# Patient Record
Sex: Female | Born: 1965 | Race: White | Hispanic: No | Smoking: Former smoker
Health system: Southern US, Community
[De-identification: ages and names within clinical notes are randomized; demographics above are authoritative.]

## PROBLEM LIST (undated history)

## (undated) DIAGNOSIS — K760 Fatty (change of) liver, not elsewhere classified: Secondary | ICD-10-CM

## (undated) DIAGNOSIS — F4310 Post-traumatic stress disorder, unspecified: Secondary | ICD-10-CM

## (undated) DIAGNOSIS — M797 Fibromyalgia: Secondary | ICD-10-CM

## (undated) DIAGNOSIS — I10 Essential (primary) hypertension: Secondary | ICD-10-CM

## (undated) DIAGNOSIS — J449 Chronic obstructive pulmonary disease, unspecified: Secondary | ICD-10-CM

## (undated) DIAGNOSIS — G35D Multiple sclerosis, unspecified: Secondary | ICD-10-CM

## (undated) DIAGNOSIS — D649 Anemia, unspecified: Secondary | ICD-10-CM

## (undated) DIAGNOSIS — K219 Gastro-esophageal reflux disease without esophagitis: Secondary | ICD-10-CM

## (undated) DIAGNOSIS — G35 Multiple sclerosis: Secondary | ICD-10-CM

## (undated) DIAGNOSIS — F329 Major depressive disorder, single episode, unspecified: Secondary | ICD-10-CM

## (undated) DIAGNOSIS — T7840XA Allergy, unspecified, initial encounter: Secondary | ICD-10-CM

## (undated) DIAGNOSIS — Z5189 Encounter for other specified aftercare: Secondary | ICD-10-CM

## (undated) DIAGNOSIS — F410 Panic disorder [episodic paroxysmal anxiety] without agoraphobia: Secondary | ICD-10-CM

## (undated) DIAGNOSIS — M5030 Other cervical disc degeneration, unspecified cervical region: Secondary | ICD-10-CM

## (undated) HISTORY — DX: Encounter for other specified aftercare: Z51.89

## (undated) HISTORY — DX: Other cervical disc degeneration, unspecified cervical region: M50.30

## (undated) HISTORY — PX: TONSILLECTOMY: SUR1361

## (undated) HISTORY — DX: Allergy, unspecified, initial encounter: T78.40XA

## (undated) HISTORY — PX: OTHER SURGICAL HISTORY: SHX169

## (undated) HISTORY — PX: HEMORRHOID SURGERY: SHX153

## (undated) HISTORY — PX: ABDOMINAL HYSTERECTOMY: SHX81

## (undated) HISTORY — DX: Anemia, unspecified: D64.9

## (undated) HISTORY — PX: COLONOSCOPY: SHX174

## (undated) HISTORY — PX: WRIST SURGERY: SHX841

## (undated) HISTORY — PX: TUBAL LIGATION: SHX77

## (undated) HISTORY — PX: UPPER GASTROINTESTINAL ENDOSCOPY: SHX188

---

## 2006-08-03 ENCOUNTER — Encounter: Admission: RE | Admit: 2006-08-03 | Discharge: 2006-08-03 | Payer: Self-pay | Admitting: Internal Medicine

## 2006-12-05 ENCOUNTER — Encounter: Admission: RE | Admit: 2006-12-05 | Discharge: 2006-12-05 | Payer: Self-pay | Admitting: Occupational Medicine

## 2006-12-15 ENCOUNTER — Emergency Department (HOSPITAL_COMMUNITY): Admission: EM | Admit: 2006-12-15 | Discharge: 2006-12-15 | Payer: Self-pay | Admitting: Family Medicine

## 2007-02-06 ENCOUNTER — Encounter: Admission: RE | Admit: 2007-02-06 | Discharge: 2007-02-06 | Payer: Self-pay | Admitting: Internal Medicine

## 2007-10-19 ENCOUNTER — Ambulatory Visit (HOSPITAL_COMMUNITY): Admission: RE | Admit: 2007-10-19 | Discharge: 2007-10-19 | Payer: Self-pay | Admitting: Neurology

## 2007-11-06 ENCOUNTER — Ambulatory Visit (HOSPITAL_COMMUNITY): Admission: RE | Admit: 2007-11-06 | Discharge: 2007-11-06 | Payer: Self-pay | Admitting: Neurology

## 2007-11-13 ENCOUNTER — Ambulatory Visit (HOSPITAL_COMMUNITY): Admission: RE | Admit: 2007-11-13 | Discharge: 2007-11-13 | Payer: Self-pay | Admitting: Neurology

## 2008-10-21 ENCOUNTER — Emergency Department (HOSPITAL_COMMUNITY): Admission: EM | Admit: 2008-10-21 | Discharge: 2008-10-21 | Payer: Self-pay | Admitting: Emergency Medicine

## 2009-04-07 ENCOUNTER — Encounter: Admission: RE | Admit: 2009-04-07 | Discharge: 2009-04-07 | Payer: Self-pay | Admitting: Neurology

## 2009-10-24 ENCOUNTER — Emergency Department (HOSPITAL_COMMUNITY): Admission: EM | Admit: 2009-10-24 | Discharge: 2009-10-24 | Payer: Self-pay | Admitting: Family Medicine

## 2009-11-13 ENCOUNTER — Encounter: Admission: RE | Admit: 2009-11-13 | Discharge: 2009-11-13 | Payer: Self-pay | Admitting: Internal Medicine

## 2009-11-25 ENCOUNTER — Encounter: Admission: RE | Admit: 2009-11-25 | Discharge: 2009-11-25 | Payer: Self-pay | Admitting: Internal Medicine

## 2010-01-25 ENCOUNTER — Emergency Department (HOSPITAL_COMMUNITY): Admission: EM | Admit: 2010-01-25 | Discharge: 2010-01-25 | Payer: Self-pay | Admitting: Family Medicine

## 2010-02-03 ENCOUNTER — Encounter: Admission: RE | Admit: 2010-02-03 | Discharge: 2010-02-03 | Payer: Self-pay | Admitting: Internal Medicine

## 2010-08-27 ENCOUNTER — Ambulatory Visit (HOSPITAL_COMMUNITY)
Admission: RE | Admit: 2010-08-27 | Discharge: 2010-08-27 | Payer: Self-pay | Source: Home / Self Care | Admitting: Neurology

## 2010-10-17 ENCOUNTER — Encounter: Payer: Self-pay | Admitting: Neurology

## 2010-10-17 ENCOUNTER — Encounter: Payer: Self-pay | Admitting: Internal Medicine

## 2010-10-18 ENCOUNTER — Encounter: Payer: Self-pay | Admitting: Neurology

## 2010-10-19 ENCOUNTER — Encounter
Admission: RE | Admit: 2010-10-19 | Discharge: 2010-10-19 | Payer: Self-pay | Source: Home / Self Care | Attending: Internal Medicine | Admitting: Internal Medicine

## 2010-11-27 ENCOUNTER — Emergency Department (HOSPITAL_COMMUNITY)
Admission: EM | Admit: 2010-11-27 | Discharge: 2010-11-28 | Disposition: A | Discharge status: Home / Self Care | Payer: 59 | Attending: Emergency Medicine | Admitting: Emergency Medicine

## 2010-11-27 ENCOUNTER — Emergency Department (HOSPITAL_COMMUNITY): Payer: 59

## 2010-11-27 DIAGNOSIS — K219 Gastro-esophageal reflux disease without esophagitis: Secondary | ICD-10-CM | POA: Insufficient documentation

## 2010-11-27 DIAGNOSIS — S8000XA Contusion of unspecified knee, initial encounter: Secondary | ICD-10-CM | POA: Insufficient documentation

## 2010-11-27 DIAGNOSIS — F341 Dysthymic disorder: Secondary | ICD-10-CM | POA: Insufficient documentation

## 2010-11-27 DIAGNOSIS — M1990 Unspecified osteoarthritis, unspecified site: Secondary | ICD-10-CM | POA: Insufficient documentation

## 2010-11-27 DIAGNOSIS — M25529 Pain in unspecified elbow: Secondary | ICD-10-CM | POA: Insufficient documentation

## 2010-11-27 DIAGNOSIS — S52599A Other fractures of lower end of unspecified radius, initial encounter for closed fracture: Secondary | ICD-10-CM | POA: Insufficient documentation

## 2010-11-27 DIAGNOSIS — Y9351 Activity, roller skating (inline) and skateboarding: Secondary | ICD-10-CM | POA: Insufficient documentation

## 2010-11-27 DIAGNOSIS — M25429 Effusion, unspecified elbow: Secondary | ICD-10-CM | POA: Insufficient documentation

## 2010-11-27 DIAGNOSIS — IMO0002 Reserved for concepts with insufficient information to code with codable children: Secondary | ICD-10-CM | POA: Insufficient documentation

## 2010-11-27 DIAGNOSIS — M25539 Pain in unspecified wrist: Secondary | ICD-10-CM | POA: Insufficient documentation

## 2010-11-30 ENCOUNTER — Encounter (HOSPITAL_COMMUNITY)
Admission: RE | Admit: 2010-11-30 | Discharge: 2010-11-30 | Disposition: A | Discharge status: Home / Self Care | Payer: 59 | Source: Ambulatory Visit | Attending: Orthopaedic Surgery | Admitting: Orthopaedic Surgery

## 2010-11-30 DIAGNOSIS — Z01810 Encounter for preprocedural cardiovascular examination: Secondary | ICD-10-CM | POA: Insufficient documentation

## 2010-11-30 DIAGNOSIS — Z01812 Encounter for preprocedural laboratory examination: Secondary | ICD-10-CM | POA: Insufficient documentation

## 2010-11-30 LAB — CBC
HCT: 32.9 % — ABNORMAL LOW (ref 36.0–46.0)
Hemoglobin: 10.9 g/dL — ABNORMAL LOW (ref 12.0–15.0)
MCH: 27.5 pg (ref 26.0–34.0)
MCHC: 33.1 g/dL (ref 30.0–36.0)
RDW: 13 % (ref 11.5–15.5)

## 2010-11-30 LAB — BASIC METABOLIC PANEL
CO2: 31 mEq/L (ref 19–32)
Calcium: 8.9 mg/dL (ref 8.4–10.5)
Creatinine, Ser: 0.67 mg/dL (ref 0.4–1.2)
GFR calc Af Amer: >60 mL/min (ref >60)
GFR calc non Af Amer: >60 mL/min (ref >60)
Glucose, Bld: 78 mg/dL (ref 70–99)

## 2010-11-30 LAB — SURGICAL PCR SCREEN
MRSA, PCR: NEGATIVE
Staphylococcus aureus: NEGATIVE

## 2010-11-30 LAB — URINALYSIS, ROUTINE W REFLEX MICROSCOPIC
Bilirubin Urine: NEGATIVE
Hgb urine dipstick: NEGATIVE
Protein, ur: NEGATIVE mg/dL
Urobilinogen, UA: 0.2 mg/dL (ref 0.0–1.0)

## 2010-12-01 ENCOUNTER — Observation Stay (HOSPITAL_COMMUNITY)
Admission: RE | Admit: 2010-12-01 | Discharge: 2010-12-02 | Disposition: A | Discharge status: Home / Self Care | Payer: 59 | Source: Ambulatory Visit | Attending: Orthopaedic Surgery | Admitting: Orthopaedic Surgery

## 2010-12-01 DIAGNOSIS — F3289 Other specified depressive episodes: Secondary | ICD-10-CM | POA: Insufficient documentation

## 2010-12-01 DIAGNOSIS — X58XXXA Exposure to other specified factors, initial encounter: Secondary | ICD-10-CM | POA: Insufficient documentation

## 2010-12-01 DIAGNOSIS — F329 Major depressive disorder, single episode, unspecified: Secondary | ICD-10-CM | POA: Insufficient documentation

## 2010-12-01 DIAGNOSIS — S52539A Colles' fracture of unspecified radius, initial encounter for closed fracture: Principal | ICD-10-CM | POA: Insufficient documentation

## 2010-12-01 DIAGNOSIS — K219 Gastro-esophageal reflux disease without esophagitis: Secondary | ICD-10-CM | POA: Insufficient documentation

## 2010-12-01 DIAGNOSIS — F411 Generalized anxiety disorder: Secondary | ICD-10-CM | POA: Insufficient documentation

## 2010-12-15 ENCOUNTER — Inpatient Hospital Stay (INDEPENDENT_AMBULATORY_CARE_PROVIDER_SITE_OTHER)
Admission: RE | Admit: 2010-12-15 | Discharge: 2010-12-15 | Disposition: A | Discharge status: Home / Self Care | Payer: 59 | Source: Ambulatory Visit | Attending: Family Medicine | Admitting: Family Medicine

## 2010-12-15 DIAGNOSIS — K5900 Constipation, unspecified: Secondary | ICD-10-CM

## 2010-12-21 NOTE — Op Note (Signed)
  NAMEELESHIA, WOOLEY               ACCOUNT NO.:  [id number]  MEDICAL RECORD NO.:  [id number]           PATIENT TYPE:  I  LOCATION:  5008                         FACILITY:  [facility]  PHYSICIAN:  Ryker Pherigo C. Ophelia Charter, M.D.    DATE OF BIRTH:  1966-02-03  DATE OF PROCEDURE:  12/01/2010 DATE OF DISCHARGE:                              OPERATIVE REPORT   PREOPERATIVE DIAGNOSIS:  Right distal radius displaced intra-articular fracture.  POSTOPERATIVE DIAGNOSIS:  Right distal radius displaced intra-articular fracture.  PROCEDURE:  Open reduction and internal fixation, right distal radius fracture.  SURGEON:  Taraann Olthoff C. Ophelia Charter, MD  ANESTHESIA:  General with preoperative scalene block.  ESTIMATED BLOOD LOSS:  Minimal.  TOURNIQUET TIME:  Approximately 30 minutes Esmarch.  PROCEDURE:  After standard prepping and draping, preoperative Ancef prophylaxis, time-out procedure, sterile skin marker, Esmarch application up to the midforearm.  Incision was made cutting through the anterior flexor carpi radialis sheath and then pulling the FCR toward the radial side protecting radial artery, cutting through the posterior sheath, stripping the pronator from radial to ulnar side to expose the fracture.  Reduction was performed.  Small elevator was placed in the distal RU joint reducing the fragment back in anatomic position.  DVR plate, right narrow was selected, placed, pinned, slot hole drilled and then standard fixation with 1 proximal screw adjusting the length and filling the distal roll from ulnar side to radial including the styloid fragment, mostly 18 and 20-mm lengths and then the proximal row similarly.  All screws were locked down.  Checked, fluoroscopic pictures were taken.  The second screw was placed in the most proximal hole, bicortical 12 mm locked and spot fluoro pictures were taken confirming excellent position alignment.  Irrigation, Esmarch removal, hemostasis with cautery and standard  closure with 2-0 Vicryl in the subcutaneous tissue, 4-0 Vicryl in subcuticular closure.  Tincture of Benzoin, Steri- Strips, 4x4s, posterior splint, Webril and Ace wrap.  Time-out procedure was completed at the end of the case.  Instrument count and needle count was correct.     Maryn Freelove C. Ophelia Charter, M.D.     MCY/MEDQ  D:  12/01/2010  T:  12/02/2010  Job:  [id number]  Electronically Signed by Annell Greening M.D. on 12/21/2010 05:58:54 PM

## 2011-01-10 LAB — POCT RAPID STREP A (OFFICE): Streptococcus, Group A Screen (Direct): NEGATIVE

## 2011-05-01 IMAGING — CR DG WRIST COMPLETE 3+V*R*
4 series · 4 of 4 positions shown · non-contrast
Comparison: None

CLINICAL DATA: Right wrist pain, fell skating ring

RIGHT WRIST - COMPLETE 3+ VIEW

[x wrist pa right]
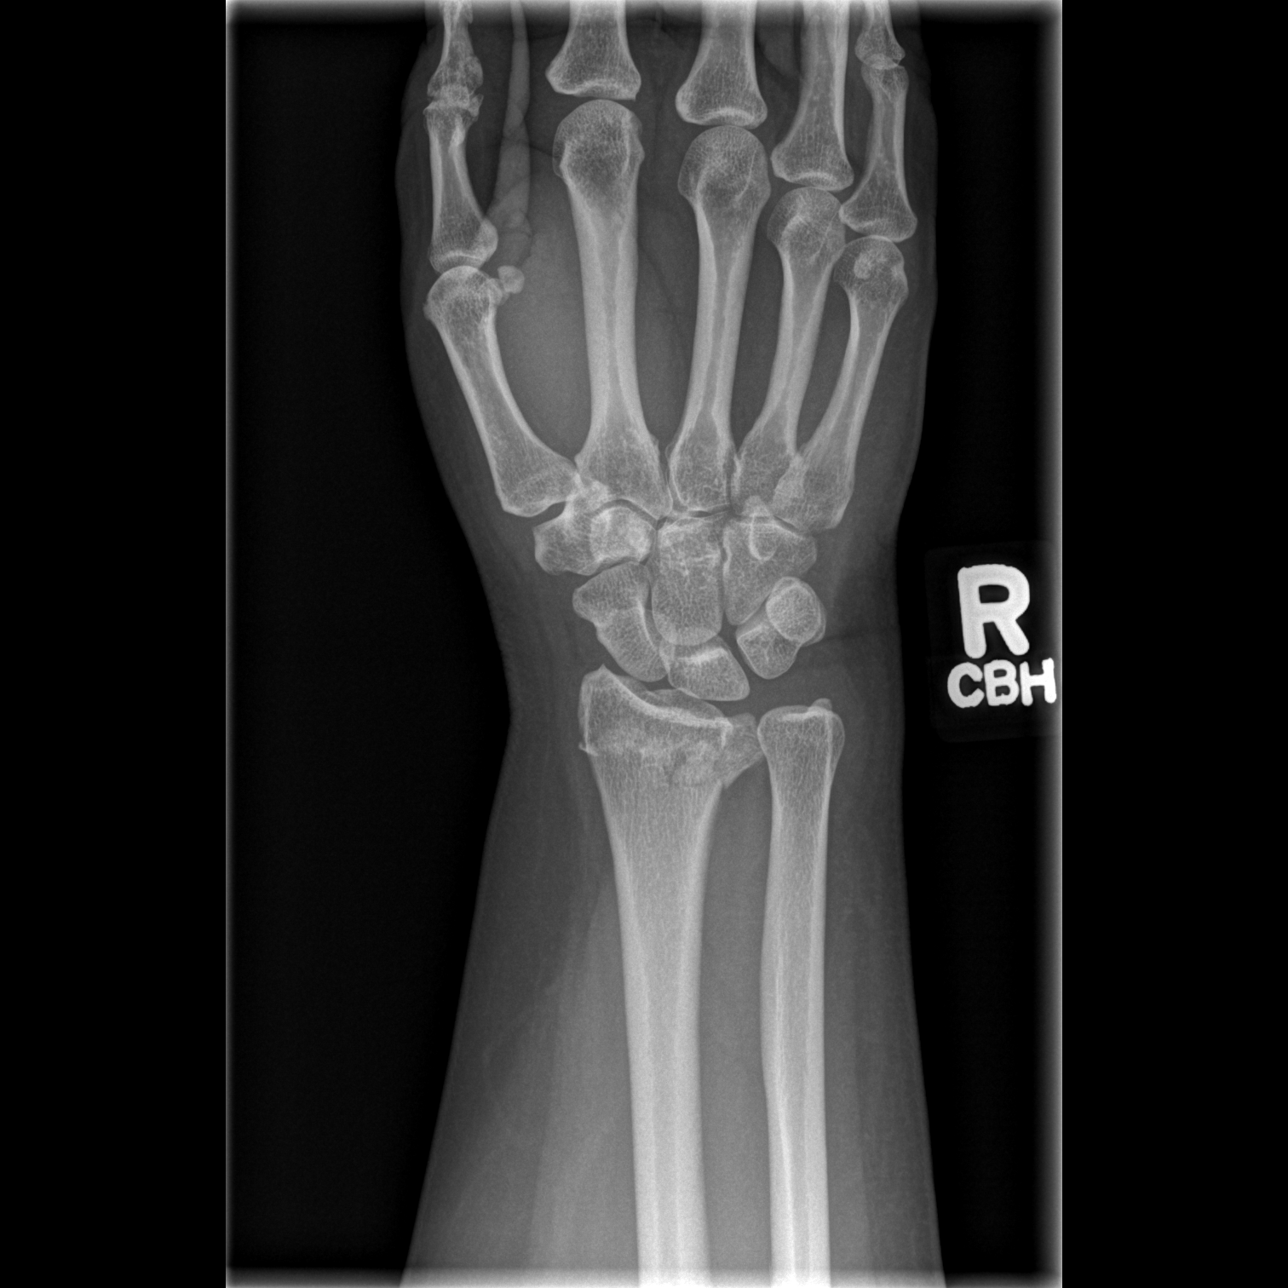

[x wrist obl right]
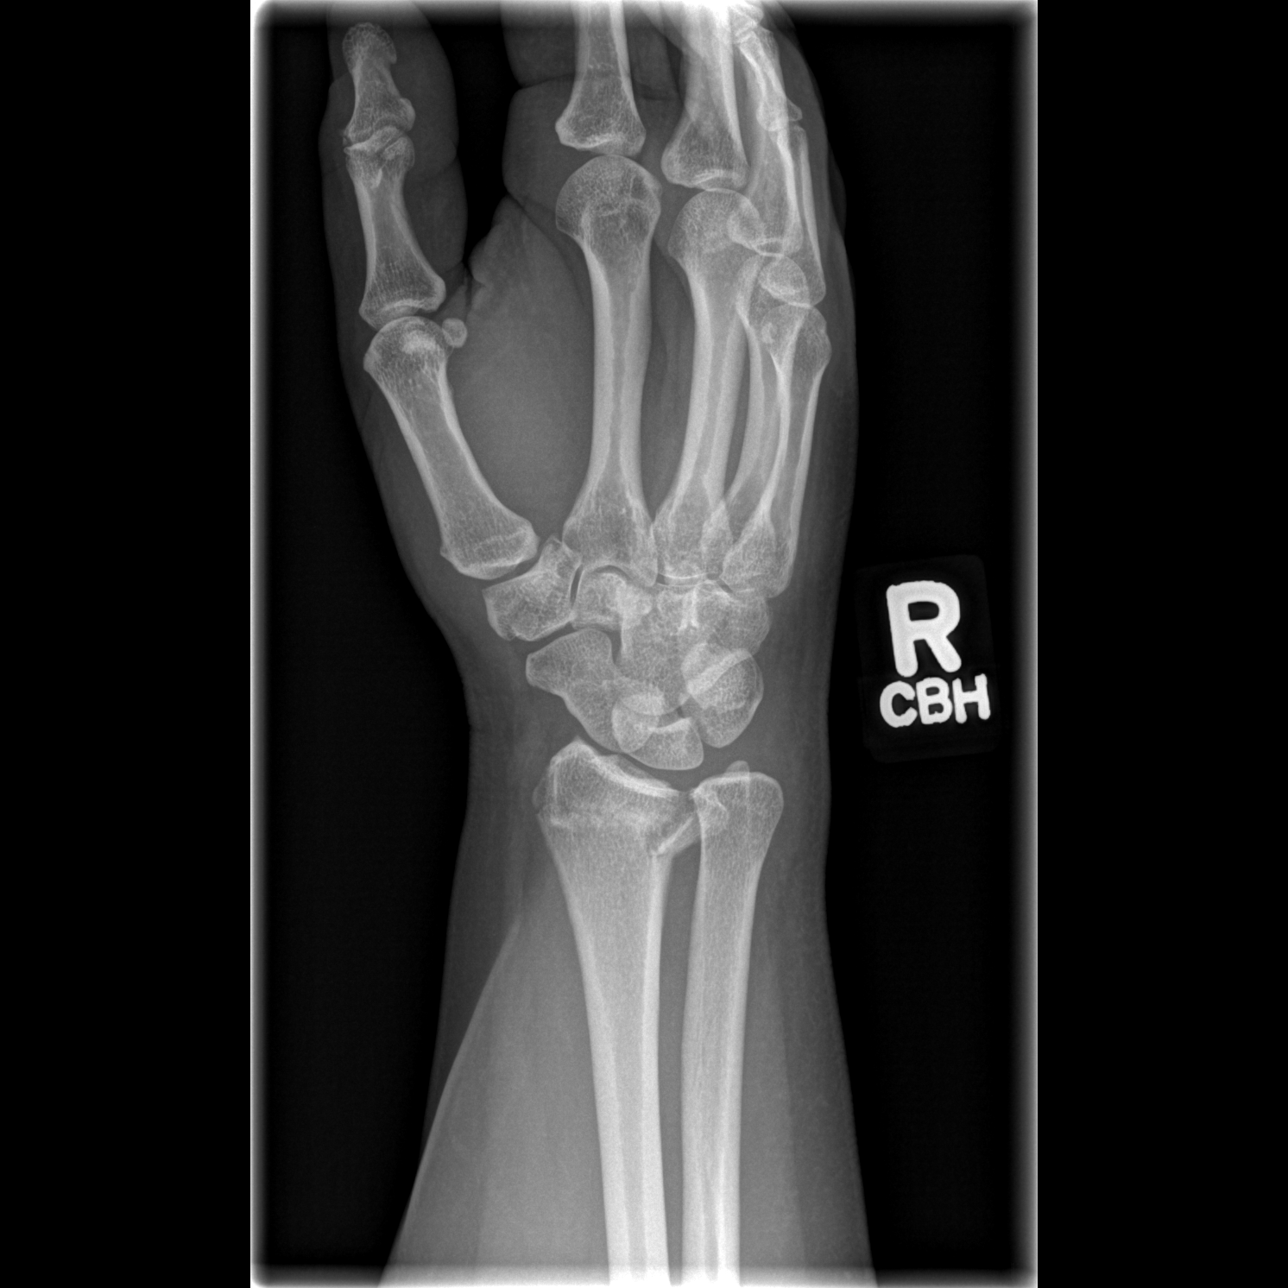

[x wrist lat right]
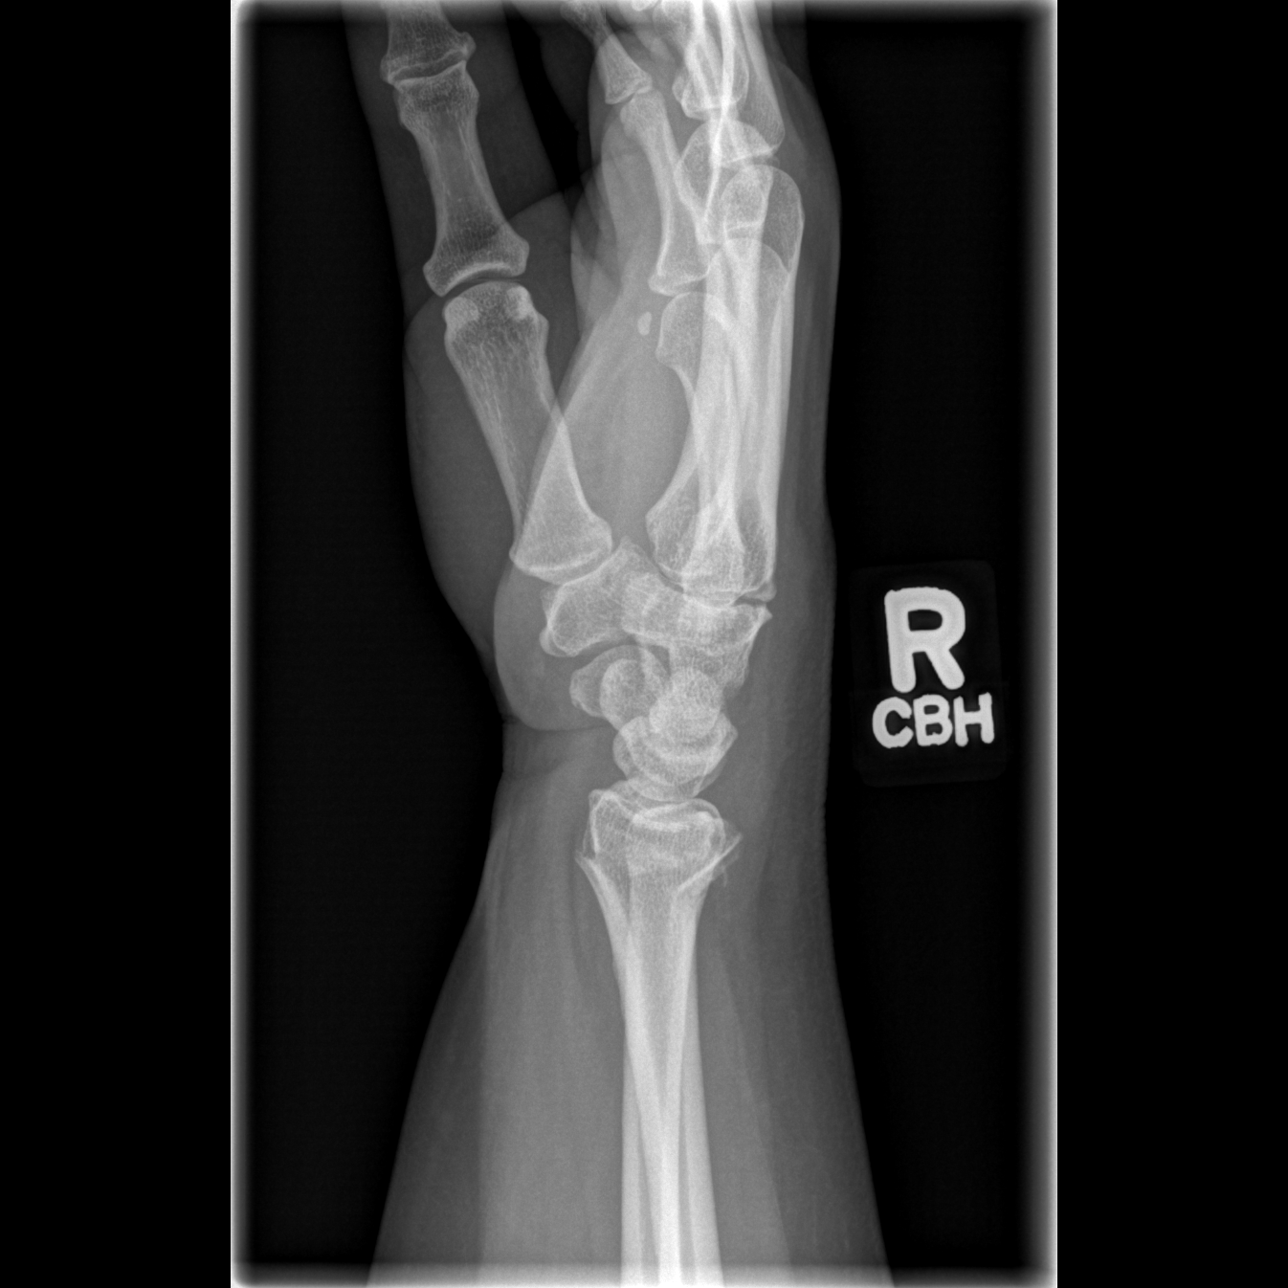

[x navicular]
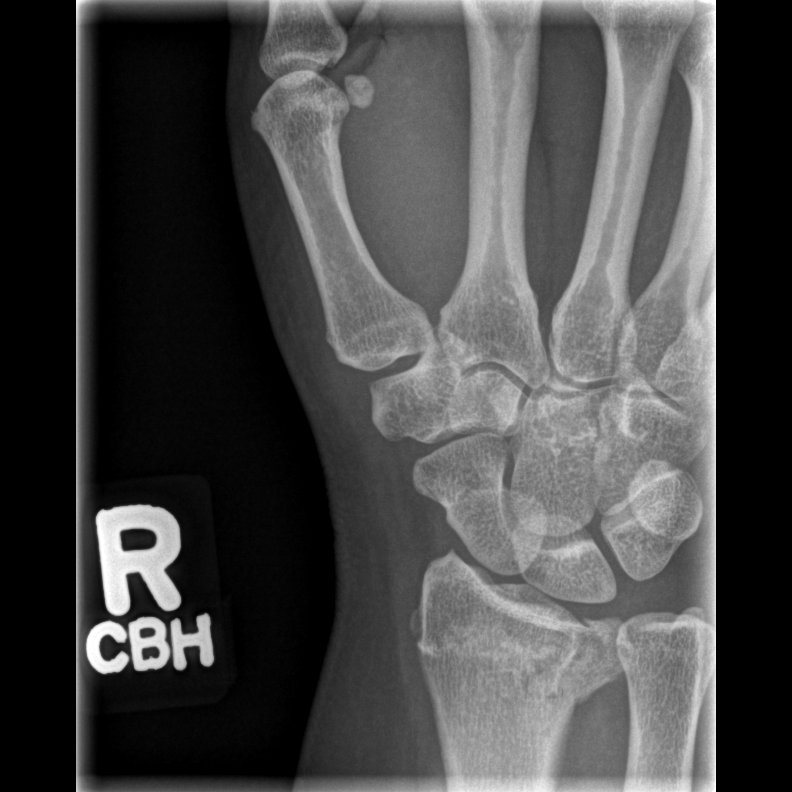

[4 of 4 positions shown; findings below may reference images not displayed]

FINDINGS: Comminuted intra-articular fracture distal right radial metaphysis.
Apex volar angulation with mild dorsal tilt of distal radial
articular surface.
Fracture extends intra-articular at the radiocarpal joint and
questionably the distal radioulnar joint.
No additional fracture, dislocation or bone destruction.
IMPRESSION: Comminuted intra-articular fracture distal right radius as above.

## 2011-06-17 LAB — GRAM STAIN

## 2011-06-17 LAB — OLIGOCLONAL BANDS, CSF + SERM
Albumin Index: 4.1 ratio (ref 0.0–9.0)
CSF Oligoclonal Bands: POSITIVE
IgG Index, CSF: 0.99 ratio — ABNORMAL HIGH (ref 0.28–0.66)
MS CNS IgG Synthesis Rate: 8.1 mg/d — ABNORMAL HIGH (ref 0.0–8.0)

## 2011-06-17 LAB — CSF CELL COUNT WITH DIFFERENTIAL
RBC Count, CSF: 4 — ABNORMAL HIGH
WBC, CSF: 6 — ABNORMAL HIGH

## 2011-06-17 LAB — B. BURGDORFI ANTIBODIES, CSF: Lyme Ab: 0.03 LIV

## 2011-12-20 ENCOUNTER — Other Ambulatory Visit (HOSPITAL_COMMUNITY): Payer: Self-pay | Admitting: Internal Medicine

## 2011-12-20 DIAGNOSIS — R110 Nausea: Secondary | ICD-10-CM

## 2011-12-22 ENCOUNTER — Ambulatory Visit (HOSPITAL_COMMUNITY)
Admission: RE | Admit: 2011-12-22 | Discharge: 2011-12-22 | Disposition: A | Discharge status: Home / Self Care | Payer: 59 | Source: Ambulatory Visit | Attending: Internal Medicine | Admitting: Internal Medicine

## 2011-12-22 DIAGNOSIS — R110 Nausea: Secondary | ICD-10-CM | POA: Insufficient documentation

## 2011-12-22 DIAGNOSIS — K7689 Other specified diseases of liver: Secondary | ICD-10-CM | POA: Insufficient documentation

## 2011-12-22 LAB — COMPREHENSIVE METABOLIC PANEL
ALT: 20 U/L (ref 0–35)
AST: 18 U/L (ref 0–37)
Albumin: 4 g/dL (ref 3.5–5.2)
Alkaline Phosphatase: 97 U/L (ref 39–117)
BUN: 8 mg/dL (ref 6–23)
CO2: 29 mEq/L (ref 19–32)
Calcium: 10 mg/dL (ref 8.4–10.5)
Chloride: 100 mEq/L (ref 96–112)
Creatinine, Ser: 0.61 mg/dL (ref 0.50–1.10)
GFR calc Af Amer: >90 mL/min (ref >90)
GFR calc non Af Amer: >90 mL/min (ref >90)
Glucose, Bld: 91 mg/dL (ref 70–99)
Potassium: 4.3 mEq/L (ref 3.5–5.1)
Sodium: 140 mEq/L (ref 135–145)
Total Bilirubin: 0.2 mg/dL — ABNORMAL LOW (ref 0.3–1.2)
Total Protein: 7.6 g/dL (ref 6.0–8.3)

## 2011-12-22 LAB — CBC
Hemoglobin: 12.7 g/dL (ref 12.0–15.0)
MCHC: 32.8 g/dL (ref 30.0–36.0)
RBC: 4.64 MIL/uL (ref 3.87–5.11)

## 2011-12-22 LAB — LIPID PANEL
HDL: 50 mg/dL (ref >39)
LDL Cholesterol: 152 mg/dL — ABNORMAL HIGH (ref 0–99)
Total CHOL/HDL Ratio: 4.6 RATIO

## 2011-12-22 LAB — TSH: TSH: 4.005 u[IU]/mL (ref 0.350–4.500)

## 2011-12-22 LAB — T3 UPTAKE: T3 Uptake Ratio: 31.8 % (ref 22.5–37.0)

## 2012-07-13 ENCOUNTER — Other Ambulatory Visit (HOSPITAL_COMMUNITY): Payer: Self-pay | Admitting: Internal Medicine

## 2012-07-13 DIAGNOSIS — IMO0001 Reserved for inherently not codable concepts without codable children: Secondary | ICD-10-CM

## 2012-07-13 DIAGNOSIS — M5137 Other intervertebral disc degeneration, lumbosacral region: Secondary | ICD-10-CM

## 2012-07-16 ENCOUNTER — Ambulatory Visit (HOSPITAL_COMMUNITY)
Admission: RE | Admit: 2012-07-16 | Discharge: 2012-07-16 | Disposition: A | Discharge status: Home / Self Care | Payer: 59 | Source: Ambulatory Visit | Attending: Internal Medicine | Admitting: Internal Medicine

## 2012-07-16 DIAGNOSIS — M6281 Muscle weakness (generalized): Secondary | ICD-10-CM | POA: Insufficient documentation

## 2012-07-16 DIAGNOSIS — R209 Unspecified disturbances of skin sensation: Secondary | ICD-10-CM | POA: Insufficient documentation

## 2012-07-16 DIAGNOSIS — IMO0001 Reserved for inherently not codable concepts without codable children: Secondary | ICD-10-CM

## 2012-07-16 DIAGNOSIS — M47814 Spondylosis without myelopathy or radiculopathy, thoracic region: Secondary | ICD-10-CM | POA: Insufficient documentation

## 2012-07-16 DIAGNOSIS — M51379 Other intervertebral disc degeneration, lumbosacral region without mention of lumbar back pain or lower extremity pain: Secondary | ICD-10-CM | POA: Insufficient documentation

## 2012-07-16 DIAGNOSIS — G35 Multiple sclerosis: Secondary | ICD-10-CM | POA: Insufficient documentation

## 2012-07-16 DIAGNOSIS — R269 Unspecified abnormalities of gait and mobility: Secondary | ICD-10-CM | POA: Insufficient documentation

## 2012-07-16 DIAGNOSIS — IMO0002 Reserved for concepts with insufficient information to code with codable children: Secondary | ICD-10-CM | POA: Insufficient documentation

## 2012-07-16 DIAGNOSIS — M519 Unspecified thoracic, thoracolumbar and lumbosacral intervertebral disc disorder: Secondary | ICD-10-CM | POA: Insufficient documentation

## 2012-07-16 DIAGNOSIS — M5137 Other intervertebral disc degeneration, lumbosacral region: Secondary | ICD-10-CM

## 2012-07-16 DIAGNOSIS — M5124 Other intervertebral disc displacement, thoracic region: Secondary | ICD-10-CM | POA: Insufficient documentation

## 2012-07-17 ENCOUNTER — Ambulatory Visit (HOSPITAL_COMMUNITY): Admission: RE | Admit: 2012-07-17 | Payer: 59 | Source: Ambulatory Visit

## 2012-07-17 ENCOUNTER — Ambulatory Visit (HOSPITAL_COMMUNITY): Payer: 59

## 2012-09-03 ENCOUNTER — Emergency Department (HOSPITAL_COMMUNITY)
Admission: EM | Admit: 2012-09-03 | Discharge: 2012-09-03 | Disposition: A | Discharge status: Home / Self Care | Payer: 59 | Attending: Emergency Medicine | Admitting: Emergency Medicine

## 2012-09-03 ENCOUNTER — Other Ambulatory Visit: Payer: Self-pay

## 2012-09-03 ENCOUNTER — Emergency Department (HOSPITAL_COMMUNITY): Payer: 59

## 2012-09-03 ENCOUNTER — Encounter (HOSPITAL_COMMUNITY): Payer: Self-pay

## 2012-09-03 DIAGNOSIS — J4489 Other specified chronic obstructive pulmonary disease: Secondary | ICD-10-CM | POA: Insufficient documentation

## 2012-09-03 DIAGNOSIS — K219 Gastro-esophageal reflux disease without esophagitis: Secondary | ICD-10-CM | POA: Insufficient documentation

## 2012-09-03 DIAGNOSIS — Z8669 Personal history of other diseases of the nervous system and sense organs: Secondary | ICD-10-CM | POA: Insufficient documentation

## 2012-09-03 DIAGNOSIS — R42 Dizziness and giddiness: Secondary | ICD-10-CM | POA: Insufficient documentation

## 2012-09-03 DIAGNOSIS — Z87891 Personal history of nicotine dependence: Secondary | ICD-10-CM | POA: Insufficient documentation

## 2012-09-03 DIAGNOSIS — Z8659 Personal history of other mental and behavioral disorders: Secondary | ICD-10-CM | POA: Insufficient documentation

## 2012-09-03 DIAGNOSIS — F410 Panic disorder [episodic paroxysmal anxiety] without agoraphobia: Secondary | ICD-10-CM | POA: Insufficient documentation

## 2012-09-03 DIAGNOSIS — K769 Liver disease, unspecified: Secondary | ICD-10-CM | POA: Insufficient documentation

## 2012-09-03 DIAGNOSIS — G35 Multiple sclerosis: Secondary | ICD-10-CM | POA: Insufficient documentation

## 2012-09-03 DIAGNOSIS — J449 Chronic obstructive pulmonary disease, unspecified: Secondary | ICD-10-CM | POA: Insufficient documentation

## 2012-09-03 DIAGNOSIS — Z8739 Personal history of other diseases of the musculoskeletal system and connective tissue: Secondary | ICD-10-CM | POA: Insufficient documentation

## 2012-09-03 DIAGNOSIS — F3289 Other specified depressive episodes: Secondary | ICD-10-CM | POA: Insufficient documentation

## 2012-09-03 DIAGNOSIS — F329 Major depressive disorder, single episode, unspecified: Secondary | ICD-10-CM | POA: Insufficient documentation

## 2012-09-03 DIAGNOSIS — Z79899 Other long term (current) drug therapy: Secondary | ICD-10-CM | POA: Insufficient documentation

## 2012-09-03 DIAGNOSIS — I10 Essential (primary) hypertension: Secondary | ICD-10-CM | POA: Insufficient documentation

## 2012-09-03 HISTORY — DX: Major depressive disorder, single episode, unspecified: F32.9

## 2012-09-03 HISTORY — DX: Post-traumatic stress disorder, unspecified: F43.10

## 2012-09-03 HISTORY — DX: Gastro-esophageal reflux disease without esophagitis: K21.9

## 2012-09-03 HISTORY — DX: Chronic obstructive pulmonary disease, unspecified: J44.9

## 2012-09-03 HISTORY — DX: Panic disorder (episodic paroxysmal anxiety): F41.0

## 2012-09-03 HISTORY — DX: Multiple sclerosis, unspecified: G35.D

## 2012-09-03 HISTORY — DX: Essential (primary) hypertension: I10

## 2012-09-03 HISTORY — DX: Fatty (change of) liver, not elsewhere classified: K76.0

## 2012-09-03 HISTORY — DX: Fibromyalgia: M79.7

## 2012-09-03 HISTORY — DX: Multiple sclerosis: G35

## 2012-09-03 LAB — URINALYSIS, ROUTINE W REFLEX MICROSCOPIC
Glucose, UA: NEGATIVE mg/dL
Hgb urine dipstick: NEGATIVE
Protein, ur: NEGATIVE mg/dL
Specific Gravity, Urine: 1.028 (ref 1.005–1.030)

## 2012-09-03 MED ORDER — MECLIZINE HCL 25 MG PO TABS
50.0000 mg | ORAL_TABLET | Freq: Once | ORAL | Status: AC
  Administered 2012-09-03: 50 mg via ORAL
  Filled 2012-09-03: qty 2

## 2012-09-03 MED ORDER — ONDANSETRON HCL 8 MG PO TABS: 8.0000 mg | ORAL_TABLET | Freq: Three times a day (TID) | ORAL | Status: DC | PRN

## 2012-09-03 MED ORDER — ONDANSETRON 8 MG PO TBDP
8.0000 mg | ORAL_TABLET | ORAL | Status: AC
  Administered 2012-09-03: 8 mg via ORAL
  Filled 2012-09-03: qty 1

## 2012-09-03 MED ORDER — MECLIZINE HCL 50 MG PO TABS: 50.0000 mg | ORAL_TABLET | Freq: Three times a day (TID) | ORAL | Status: AC | PRN

## 2012-09-03 NOTE — ED Notes (Signed)
Patient reports that she has had dizziness x 2 weeks and has gotten progressively worse. Patient reports that she has taken Vertigo and Xanax every 6 hours with no relief. Patient states her physician changed the xanax to Klonopin and still no relief. Patient has open sores on arms and trunk of body. Patient reports that she has severe anxiety and "picks at her skin." Patient als has a history of Multiple schlerosis.

## 2012-09-03 NOTE — ED Provider Notes (Signed)
History     CSN: [id number]  Arrival date & time 09/03/12  1407   First MD Initiated Contact with Patient 09/03/12 1523      Chief Complaint  Patient presents with  . Dizziness    (Consider location/radiation/quality/duration/timing/severity/associated sxs/prior treatment) HPI Pt presents with symptoms of dizziness and sensation of room spinning.  States the symptoms have been ongoing for the past 2-3 weeks.  She has been taking meclizine and zofran without much relief.  Meclizine 25mg .  States her psychiatrist changed her medication from xanax to klonopin and thought that may be the cause of the vertigo- but today told her that this would not be the cause as the symptoms would have resolved by now.  She has not seen her PMD.  No weakness in her extremities.  No changes in vision or speech.  Has had nausea, but no vomiting.  No fever.  There are no other associated systemic symptoms, there are no other alleviating or modifying factors.   Past Medical History  Diagnosis Date  . Panic disorder   . MS (multiple sclerosis)   . Hypertension   . COPD (chronic obstructive pulmonary disease)   . Major depressive disorder   . Nonalcoholic fatty liver disease   . GERD (gastroesophageal reflux disease)   . Fibromyalgia   . PTSD (post-traumatic stress disorder)     Past Surgical History  Procedure Date  . Tonsillectomy   . Cesarean section   . Hemorrhoid surgery   . Wrist surgery   . Tubal ligation     Family History  Problem Relation Age of Onset  . Hypertension Mother   . Heart failure Mother   . Hypertension Sister   . Stroke Sister     History  Substance Use Topics  . Smoking status: Former Games developer  . Smokeless tobacco: Never Used  . Alcohol Use: No    OB History    Grav Para Term Preterm Abortions TAB SAB Ect Mult Living                  Review of Systems ROS reviewed and all otherwise negative except for mentioned in HPI  Allergies  Other  Home Medications    Current Outpatient Rx  Name  Route  Sig  Dispense  Refill  . BACLOFEN 10 MG PO TABS   Oral   Take 10-20 mg by mouth 2 (two) times daily. 10 mg in am and 20 mg at bed time         . BUPROPION HCL ER (XL) 300 MG PO TB24   Oral   Take 300 mg by mouth daily.         Marland Kitchen CLONAZEPAM 1 MG PO TABS   Oral   Take 1-1.5 mg by mouth 2 (two) times daily as needed. I mg in am and 1.5 mg at bed time         . CYCLOBENZAPRINE HCL 10 MG PO TABS   Oral   Take 10 mg by mouth 3 (three) times daily as needed.         Marland Kitchen DOCUSATE SODIUM 100 MG PO CAPS   Oral   Take 100 mg by mouth 2 (two) times daily.         Marland Kitchen ESTRADIOL 0.5 MG PO TABS   Oral   Take 0.5 mg by mouth daily.         Marland Kitchen HYDROCHLOROTHIAZIDE 25 MG PO TABS   Oral   Take 25 mg  by mouth daily.         . MELOXICAM 15 MG PO TABS   Oral   Take 15 mg by mouth daily.         Marland Kitchen METHOCARBAMOL 500 MG PO TABS   Oral   Take 500 mg by mouth at bedtime.         . ADULT MULTIVITAMIN W/MINERALS CH   Oral   Take 1 tablet by mouth daily.         Marland Kitchen PANTOPRAZOLE SODIUM 40 MG PO TBEC   Oral   Take 40 mg by mouth 2 (two) times daily.         Marland Kitchen TEMAZEPAM 15 MG PO CAPS   Oral   Take 15-30 mg by mouth at bedtime as needed. Sleep         . TRAMADOL HCL 50 MG PO TABS   Oral   Take 50 mg by mouth every 6 (six) hours as needed. Pain         . MECLIZINE HCL 50 MG PO TABS   Oral   Take 1 tablet (50 mg total) by mouth 3 (three) times daily as needed.   30 tablet   0   . ONDANSETRON HCL 8 MG PO TABS   Oral   Take 1 tablet (8 mg total) by mouth every 8 (eight) hours as needed for nausea.   20 tablet   0     BP 152/87  Pulse 95  Temp 98 F (36.7 C) (Oral)  Resp 16  SpO2 100% Vitals reviewed Physical Exam Physical Examination: General appearance - alert, well appearing, and in no distress Mental status - alert, oriented to person, place, and time Eyes - PERRL, no nystagmus, EOMI, no conjunctival injection, no  scleral icterus Ears- TMS appear normal bilaterally, no erythema/pus/bulging/normal landmarks Mouth - mucous membranes moist, pharynx normal without lesions Chest - clear to auscultation, no wheezes, rales or rhonchi, symmetric air entry Heart - normal rate, regular rhythm, normal S1, S2, no murmurs, rubs, clicks or gallops Abdomen - soft, nontender, nondistended, no masses or organomegaly Neurological - alert, oriented, normal speech, no focal findings, strength 5/5 in extremities x 4, sensation intact Extremities - peripheral pulses normal, no pedal edema, no clubbing or cyanosis Skin - normal coloration and turgor, no rashes Psych- mildly anxious but calm and cooperative  ED Course  Procedures (including critical care time)  Labs Reviewed  URINALYSIS, ROUTINE W REFLEX MICROSCOPIC - Abnormal; Notable for the following:    Ketones, ur TRACE (*)     All other components within normal limits   Ct Head Wo Contrast  09/03/2012  *RADIOLOGY REPORT*  Clinical Data: Dizziness  CT HEAD WITHOUT CONTRAST  Technique:  Contiguous axial images were obtained from the base of the skull through the vertex without contrast.  Comparison: MRI brain dated 07/16/2012  Findings: No evidence of parenchymal hemorrhage or extra-axial fluid collection. No mass lesion, mass effect, or midline shift.  No CT evidence of acute infarction.  Periventricular/subcortical white matter abnormalities on MRI are not evident on CT.  Cerebral volume is age appropriate.  No ventriculomegaly.  The visualized paranasal sinuses are essentially clear. The mastoid air cells are unopacified.  No evidence of calvarial fracture.  IMPRESSION: No evidence of acute intracranial abnormality.  Periventricular/subcortical white matter abnormalities on MRI are not evident on CT.   Original Report Authenticated By: Charline Bills, M.D.      1. Vertigo  MDM  Pt presents with c/o vertigo- has been ongoing x several weeks.  Head CT  obtained and negative.  Increased dose of meclizine.  Do suspect BPPV due to increase symptoms with movement of head on exam.  However D/w patient the possibility of labrynthitis and other causes of vertigo.  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  I did warn her that if she had labrynthitis, steroids would be the treatment, but it does sound as if she has had severe reactions to steroids and is unwilling to take them at this time.  Pt advised to arrange for close f/u with her PMD, Dr Mikeal Hawthorne.  Discharged with strict return precautions.  Pt agreeable with plan.        Ethelda Chick, MD 09/03/12 1728

## 2013-02-13 ENCOUNTER — Ambulatory Visit: Payer: Self-pay | Admitting: Nurse Practitioner

## 2013-03-21 ENCOUNTER — Ambulatory Visit: Payer: Self-pay | Admitting: Nurse Practitioner

## 2015-08-12 ENCOUNTER — Other Ambulatory Visit: Payer: Self-pay

## 2015-08-12 DIAGNOSIS — Z1231 Encounter for screening mammogram for malignant neoplasm of breast: Secondary | ICD-10-CM

## 2015-09-01 ENCOUNTER — Ambulatory Visit: Payer: 59

## 2015-11-24 ENCOUNTER — Observation Stay (HOSPITAL_COMMUNITY)
Admission: EM | Admit: 2015-11-24 | Discharge: 2015-11-26 | Disposition: A | Discharge status: Home / Self Care | Payer: Medicaid Other | Attending: Family Medicine | Admitting: Family Medicine

## 2015-11-24 ENCOUNTER — Ambulatory Visit (INDEPENDENT_AMBULATORY_CARE_PROVIDER_SITE_OTHER): Payer: 59 | Admitting: Emergency Medicine

## 2015-11-24 VITALS — BP 118/88 | HR 107 | Temp 99.3°F | Resp 18 | Ht 66.0 in | Wt 204.2 lb

## 2015-11-24 DIAGNOSIS — D509 Iron deficiency anemia, unspecified: Secondary | ICD-10-CM | POA: Diagnosis not present

## 2015-11-24 DIAGNOSIS — D62 Acute posthemorrhagic anemia: Secondary | ICD-10-CM

## 2015-11-24 DIAGNOSIS — R55 Syncope and collapse: Secondary | ICD-10-CM | POA: Diagnosis present

## 2015-11-24 DIAGNOSIS — F22 Delusional disorders: Secondary | ICD-10-CM

## 2015-11-24 DIAGNOSIS — F332 Major depressive disorder, recurrent severe without psychotic features: Secondary | ICD-10-CM | POA: Insufficient documentation

## 2015-11-24 DIAGNOSIS — R42 Dizziness and giddiness: Secondary | ICD-10-CM | POA: Diagnosis not present

## 2015-11-24 DIAGNOSIS — Z79899 Other long term (current) drug therapy: Secondary | ICD-10-CM | POA: Insufficient documentation

## 2015-11-24 DIAGNOSIS — K219 Gastro-esophageal reflux disease without esophagitis: Secondary | ICD-10-CM | POA: Diagnosis not present

## 2015-11-24 DIAGNOSIS — F333 Major depressive disorder, recurrent, severe with psychotic symptoms: Secondary | ICD-10-CM | POA: Diagnosis present

## 2015-11-24 DIAGNOSIS — M797 Fibromyalgia: Secondary | ICD-10-CM | POA: Insufficient documentation

## 2015-11-24 DIAGNOSIS — F4310 Post-traumatic stress disorder, unspecified: Secondary | ICD-10-CM | POA: Diagnosis not present

## 2015-11-24 DIAGNOSIS — F32A Depression, unspecified: Secondary | ICD-10-CM

## 2015-11-24 DIAGNOSIS — K922 Gastrointestinal hemorrhage, unspecified: Secondary | ICD-10-CM | POA: Insufficient documentation

## 2015-11-24 DIAGNOSIS — G35D Multiple sclerosis, unspecified: Secondary | ICD-10-CM

## 2015-11-24 DIAGNOSIS — G35 Multiple sclerosis: Secondary | ICD-10-CM | POA: Diagnosis present

## 2015-11-24 DIAGNOSIS — I10 Essential (primary) hypertension: Secondary | ICD-10-CM | POA: Diagnosis not present

## 2015-11-24 DIAGNOSIS — E876 Hypokalemia: Secondary | ICD-10-CM

## 2015-11-24 DIAGNOSIS — F329 Major depressive disorder, single episode, unspecified: Secondary | ICD-10-CM | POA: Diagnosis not present

## 2015-11-24 DIAGNOSIS — Z87891 Personal history of nicotine dependence: Secondary | ICD-10-CM | POA: Diagnosis not present

## 2015-11-24 DIAGNOSIS — J449 Chronic obstructive pulmonary disease, unspecified: Secondary | ICD-10-CM | POA: Insufficient documentation

## 2015-11-24 DIAGNOSIS — R195 Other fecal abnormalities: Secondary | ICD-10-CM | POA: Insufficient documentation

## 2015-11-24 DIAGNOSIS — F6811 Factitious disorder with predominantly psychological signs and symptoms: Secondary | ICD-10-CM

## 2015-11-24 DIAGNOSIS — D508 Other iron deficiency anemias: Secondary | ICD-10-CM | POA: Diagnosis not present

## 2015-11-24 DIAGNOSIS — Z791 Long term (current) use of non-steroidal anti-inflammatories (NSAID): Secondary | ICD-10-CM | POA: Insufficient documentation

## 2015-11-24 DIAGNOSIS — D649 Anemia, unspecified: Secondary | ICD-10-CM | POA: Insufficient documentation

## 2015-11-24 DIAGNOSIS — T39395A Adverse effect of other nonsteroidal anti-inflammatory drugs [NSAID], initial encounter: Secondary | ICD-10-CM

## 2015-11-24 LAB — URINE MICROSCOPIC-ADD ON

## 2015-11-24 LAB — CBC WITH DIFFERENTIAL/PLATELET
BASOS PCT: 0 %
Basophils Absolute: 0 10*3/uL (ref 0.0–0.1)
EOS ABS: 0 10*3/uL (ref 0.0–0.7)
Eosinophils Relative: 0 %
HCT: 23.8 % — ABNORMAL LOW (ref 36.0–46.0)
HEMOGLOBIN: 6.6 g/dL — AB (ref 12.0–15.0)
LYMPHS PCT: 14 %
Lymphs Abs: 1.5 10*3/uL (ref 0.7–4.0)
MCH: 17.3 pg — AB (ref 26.0–34.0)
MCHC: 27.7 g/dL — AB (ref 30.0–36.0)
MCV: 62.3 fL — ABNORMAL LOW (ref 78.0–100.0)
MONO ABS: 1.1 10*3/uL — AB (ref 0.1–1.0)
Monocytes Relative: 10 %
NEUTROS ABS: 8.1 10*3/uL — AB (ref 1.7–7.7)
Neutrophils Relative %: 76 %
Platelets: 456 10*3/uL — ABNORMAL HIGH (ref 150–400)
RBC: 3.82 MIL/uL — ABNORMAL LOW (ref 3.87–5.11)
RDW: 18.3 % — ABNORMAL HIGH (ref 11.5–15.5)
WBC: 10.7 10*3/uL — ABNORMAL HIGH (ref 4.0–10.5)

## 2015-11-24 LAB — FERRITIN
Ferritin: 5 ng/mL — ABNORMAL LOW (ref 10–232)
Ferritin: 4 ng/mL — ABNORMAL LOW (ref 11–307)

## 2015-11-24 LAB — COMPREHENSIVE METABOLIC PANEL
ALK PHOS: 103 U/L (ref 38–126)
ALT: 11 U/L — ABNORMAL LOW (ref 14–54)
ANION GAP: 12 (ref 5–15)
AST: 16 U/L (ref 15–41)
Albumin: 4.2 g/dL (ref 3.5–5.0)
BUN: 10 mg/dL (ref 6–20)
CALCIUM: 8.9 mg/dL (ref 8.9–10.3)
CO2: 22 mmol/L (ref 22–32)
Chloride: 101 mmol/L (ref 101–111)
Creatinine, Ser: 0.73 mg/dL (ref 0.44–1.00)
Glucose, Bld: 94 mg/dL (ref 65–99)
Potassium: 2.4 mmol/L — CL (ref 3.5–5.1)
SODIUM: 135 mmol/L (ref 135–145)
TOTAL PROTEIN: 7.2 g/dL (ref 6.5–8.1)
Total Bilirubin: 0.6 mg/dL (ref 0.3–1.2)

## 2015-11-24 LAB — RETICULOCYTES
RBC.: 3.82 MIL/uL — AB (ref 3.87–5.11)
RETIC COUNT ABSOLUTE: 57.3 10*3/uL (ref 19.0–186.0)
Retic Ct Pct: 1.5 % (ref 0.4–3.1)

## 2015-11-24 LAB — IRON AND TIBC
IRON: 12 ug/dL — AB (ref 28–170)
Saturation Ratios: 3 % — ABNORMAL LOW (ref 10.4–31.8)
TIBC: 412 ug/dL (ref 250–450)
UIBC: 400 ug/dL

## 2015-11-24 LAB — POCT CBC
Granulocyte percent: 76.2 %G (ref 37–80)
HCT, POC: 21.5 % — AB (ref 37.7–47.9)
Hemoglobin: 6.7 g/dL — AB (ref 12.2–16.2)
LYMPH, POC: 2.3 (ref 0.6–3.4)
MCH, POC: 18.1 pg — AB (ref 27–31.2)
MCHC: 31.1 g/dL — AB (ref 31.8–35.4)
MCV: 58.2 fL — AB (ref 80–97)
MID (cbc): 0.6 (ref 0–0.9)
MPV: 6.9 fL (ref 0–99.8)
PLATELET COUNT, POC: 450 10*3/uL — AB (ref 142–424)
POC Granulocyte: 9.3 — AB (ref 2–6.9)
POC LYMPH %: 19.1 % (ref 10–50)
POC MID %: 4.7 %M (ref 0–12)
RBC: 3.69 M/uL — AB (ref 4.04–5.48)
RDW, POC: 18.2 %
WBC: 12.2 10*3/uL — AB (ref 4.6–10.2)

## 2015-11-24 LAB — URINALYSIS, ROUTINE W REFLEX MICROSCOPIC
Bilirubin Urine: NEGATIVE
GLUCOSE, UA: NEGATIVE mg/dL
Hgb urine dipstick: NEGATIVE
KETONES UR: NEGATIVE mg/dL
NITRITE: NEGATIVE
PROTEIN: NEGATIVE mg/dL
Specific Gravity, Urine: 1.007 (ref 1.005–1.030)
pH: 7 (ref 5.0–8.0)

## 2015-11-24 LAB — POC OCCULT BLOOD, ED: Fecal Occult Bld: POSITIVE — AB

## 2015-11-24 LAB — ABO/RH: ABO/RH(D): A POS

## 2015-11-24 LAB — TROPONIN I

## 2015-11-24 LAB — PREPARE RBC (CROSSMATCH)

## 2015-11-24 LAB — HEMOGLOBIN: Hemoglobin: 6.3 g/dL — CL (ref 12.0–15.0)

## 2015-11-24 LAB — VITAMIN B12: VITAMIN B 12: 173 pg/mL — AB (ref 180–914)

## 2015-11-24 LAB — FOLATE: FOLATE: 7.2 ng/mL (ref >5.9)

## 2015-11-24 LAB — GLUCOSE, POCT (MANUAL RESULT ENTRY): POC GLUCOSE: 109 mg/dL — AB (ref 70–99)

## 2015-11-24 MED ORDER — SODIUM CHLORIDE 0.9 % IV SOLN
INTRAVENOUS | Status: DC
  Administered 2015-11-24: 100 mL/h via INTRAVENOUS
  Administered 2015-11-25 – 2015-11-26 (×2): via INTRAVENOUS

## 2015-11-24 MED ORDER — TOPIRAMATE 100 MG PO TABS
100.0000 mg | ORAL_TABLET | Freq: Every day | ORAL | Status: DC
  Administered 2015-11-24 – 2015-11-25 (×2): 100 mg via ORAL
  Filled 2015-11-24 (×3): qty 1

## 2015-11-24 MED ORDER — BACLOFEN 10 MG PO TABS
20.0000 mg | ORAL_TABLET | Freq: Every day | ORAL | Status: DC
  Administered 2015-11-24 – 2015-11-25 (×2): 20 mg via ORAL
  Filled 2015-11-24: qty 1
  Filled 2015-11-24: qty 2

## 2015-11-24 MED ORDER — ESTRADIOL 1 MG PO TABS
0.5000 mg | ORAL_TABLET | Freq: Every day | ORAL | Status: DC
  Administered 2015-11-24 – 2015-11-26 (×3): 0.5 mg via ORAL
  Filled 2015-11-24 (×3): qty 0.5

## 2015-11-24 MED ORDER — POTASSIUM CHLORIDE 10 MEQ/100ML IV SOLN
10.0000 meq | Freq: Once | INTRAVENOUS | Status: AC
  Administered 2015-11-24: 10 meq via INTRAVENOUS
  Filled 2015-11-24: qty 100

## 2015-11-24 MED ORDER — SODIUM CHLORIDE 0.9 % IV SOLN
10.0000 mL/h | Freq: Once | INTRAVENOUS | Status: AC
  Administered 2015-11-24: 10 mL/h via INTRAVENOUS

## 2015-11-24 MED ORDER — METHOCARBAMOL 500 MG PO TABS
500.0000 mg | ORAL_TABLET | Freq: Every day | ORAL | Status: DC
  Administered 2015-11-24 – 2015-11-25 (×2): 500 mg via ORAL
  Filled 2015-11-24 (×2): qty 1

## 2015-11-24 MED ORDER — HYDROCHLOROTHIAZIDE 25 MG PO TABS
25.0000 mg | ORAL_TABLET | Freq: Every day | ORAL | Status: DC
  Filled 2015-11-24: qty 1

## 2015-11-24 MED ORDER — SODIUM CHLORIDE 0.9 % IV BOLUS (SEPSIS)
1000.0000 mL | Freq: Once | INTRAVENOUS | Status: AC
  Administered 2015-11-24: 1000 mL via INTRAVENOUS

## 2015-11-24 MED ORDER — PRAZOSIN HCL 5 MG PO CAPS
5.0000 mg | ORAL_CAPSULE | Freq: Every day | ORAL | Status: DC
  Administered 2015-11-24 – 2015-11-25 (×2): 5 mg via ORAL
  Filled 2015-11-24 (×3): qty 1

## 2015-11-24 MED ORDER — LUBIPROSTONE 24 MCG PO CAPS
24.0000 ug | ORAL_CAPSULE | Freq: Every day | ORAL | Status: DC
  Administered 2015-11-24 – 2015-11-26 (×3): 24 ug via ORAL
  Filled 2015-11-24 (×4): qty 1

## 2015-11-24 MED ORDER — POTASSIUM CHLORIDE CRYS ER 20 MEQ PO TBCR
40.0000 meq | EXTENDED_RELEASE_TABLET | Freq: Once | ORAL | Status: AC
  Administered 2015-11-24: 40 meq via ORAL
  Filled 2015-11-24: qty 2

## 2015-11-24 MED ORDER — BUPROPION HCL ER (SR) 150 MG PO TB12
150.0000 mg | ORAL_TABLET | Freq: Every day | ORAL | Status: DC
  Administered 2015-11-24 – 2015-11-26 (×3): 150 mg via ORAL
  Filled 2015-11-24 (×3): qty 1

## 2015-11-24 MED ORDER — BACLOFEN 10 MG PO TABS
10.0000 mg | ORAL_TABLET | Freq: Every day | ORAL | Status: DC
  Administered 2015-11-25 – 2015-11-26 (×2): 10 mg via ORAL
  Filled 2015-11-24 (×2): qty 1

## 2015-11-24 MED ORDER — METOCLOPRAMIDE HCL 10 MG PO TABS
10.0000 mg | ORAL_TABLET | Freq: Four times a day (QID) | ORAL | Status: DC
  Administered 2015-11-24 – 2015-11-26 (×7): 10 mg via ORAL
  Filled 2015-11-24 (×7): qty 1

## 2015-11-24 MED ORDER — PANTOPRAZOLE SODIUM 40 MG IV SOLR
40.0000 mg | Freq: Two times a day (BID) | INTRAVENOUS | Status: DC
  Administered 2015-11-24 – 2015-11-26 (×4): 40 mg via INTRAVENOUS
  Filled 2015-11-24 (×4): qty 40

## 2015-11-24 MED ORDER — CLONAZEPAM 1 MG PO TABS
1.0000 mg | ORAL_TABLET | Freq: Two times a day (BID) | ORAL | Status: DC
  Administered 2015-11-24 – 2015-11-26 (×4): 1 mg via ORAL
  Filled 2015-11-24: qty 2
  Filled 2015-11-24 (×3): qty 1

## 2015-11-24 NOTE — ED Provider Notes (Signed)
CSN: [id number]     Arrival date & time 11/24/15  1628 History   First MD Initiated Contact with Patient 11/24/15 1634     Chief Complaint  Patient presents with  . Loss of Consciousness  . Psychiatric Evaluation     (Consider location/radiation/quality/duration/timing/severity/associated sxs/prior Treatment) HPI   50 year old female with history of MS, nonalcoholic fatty liver disease, fibromyalgia, GERD presents for evaluation of syncope. Patient report for the past month she has been having generalized fatigue, lightheadedness, dizziness, shortness of breath that seemed waxing waning. 2 weeks ago she had a bout of ?gastroenteritis with nausea vomiting diarrhea lasting for several days and since then she noticed her symptoms has gotten progressively worse. She had a syncopal episode walking from her bathroom to her bedroom 2 weeks ago, and similar episode last night. Today she was going for a job interview when she felt too unsteady to go.  She also endorsed having scalp itchiness and states that her hair is falling out. She has a history of self picking behavior which she has been doing it more frequently. She denies any change in her shampoo soap or detergent. She admits to taking "quite a few medication" but no recent medication changes. She endorse having increase stress due to the current symptoms. She denies auditory or visual hallucination and denies SI/HI. She does not drink or smoke and denies using illicit drugs. She also denies having any abnormal bleeding aside from occasionally trace of red blood noted on toilet paper when she wipes. She denies having any active chest pain or abdominal pain. She went to urgent care today for further evaluation of her generalized fatigue and symptoms of delusional parasitosis. She was found to have a hemoglobin of 6.7.  She was encouraged to come to the ER for further management.  No prior hx of blood transfusion.  Hx of total hysterectomy.       Past  Medical History  Diagnosis Date  . Panic disorder   . MS (multiple sclerosis) (HCC)   . Hypertension   . COPD (chronic obstructive pulmonary disease) (HCC)   . Major depressive disorder (HCC)   . Nonalcoholic fatty liver disease   . GERD (gastroesophageal reflux disease)   . Fibromyalgia   . PTSD (post-traumatic stress disorder)   . Allergy    Past Surgical History  Procedure Laterality Date  . Tonsillectomy    . Cesarean section    . Hemorrhoid surgery    . Wrist surgery    . Tubal ligation    . Abdominal hysterectomy     Family History  Problem Relation Age of Onset  . Hypertension Mother   . Heart failure Mother   . Hypertension Sister   . Stroke Sister    Social History  Substance Use Topics  . Smoking status: Former Games developer  . Smokeless tobacco: Never Used  . Alcohol Use: No   OB History    No data available     Review of Systems  All other systems reviewed and are negative.     Allergies  Other  Home Medications   Prior to Admission medications   Medication Sig Start Date End Date Taking? Authorizing Provider  baclofen (LIORESAL) 10 MG tablet Take 10-20 mg by mouth 2 (two) times daily. 10 mg in am and 20 mg at bed time    Historical Provider, MD  buPROPion (WELLBUTRIN XL) 300 MG 24 hr tablet Take 150 mg by mouth daily.     Historical Provider, MD  clonazePAM (KLONOPIN) 1 MG tablet Take 1-1.5 mg by mouth 2 (two) times daily as needed. Reported on 11/24/2015    Historical Provider, MD  cyclobenzaprine (FLEXERIL) 10 MG tablet Take 10 mg by mouth 3 (three) times daily as needed. Reported on 11/24/2015    Historical Provider, MD  docusate sodium (COLACE) 100 MG capsule Take 100 mg by mouth 2 (two) times daily. Reported on 11/24/2015    Historical Provider, MD  estradiol (ESTRACE) 0.5 MG tablet Take 0.5 mg by mouth daily.    Historical Provider, MD  hydrochlorothiazide (HYDRODIURIL) 25 MG tablet Take 25 mg by mouth daily. Reported on 11/24/2015    Historical  Provider, MD  meclizine (ANTIVERT) 50 MG tablet Take 1 tablet (50 mg total) by mouth 3 (three) times daily as needed. 09/03/12   Jerelyn Scott, MD  meloxicam (MOBIC) 15 MG tablet Take 15 mg by mouth daily. Reported on 11/24/2015    Historical Provider, MD  methocarbamol (ROBAXIN) 500 MG tablet Take 500 mg by mouth at bedtime.    Historical Provider, MD  metoCLOPramide (REGLAN) 10 MG tablet Take 10 mg by mouth 4 (four) times daily.    Historical Provider, MD  Multiple Vitamin (MULTIVITAMIN WITH MINERALS) TABS Take 1 tablet by mouth daily.    Historical Provider, MD  naproxen (NAPROSYN) 250 MG tablet Take by mouth 2 (two) times daily with a meal.    Historical Provider, MD  ondansetron (ZOFRAN) 8 MG tablet Take 1 tablet (8 mg total) by mouth every 8 (eight) hours as needed for nausea. Patient not taking: Reported on 11/24/2015 09/03/12   Jerelyn Scott, MD  pantoprazole (PROTONIX) 40 MG tablet Take 40 mg by mouth 2 (two) times daily. Reported on 11/24/2015    Historical Provider, MD  temazepam (RESTORIL) 15 MG capsule Take 15-30 mg by mouth at bedtime as needed. Reported on 11/24/2015    Historical Provider, MD  topiramate (TOPAMAX) 100 MG tablet Take 100 mg by mouth 2 (two) times daily.    Historical Provider, MD  traMADol (ULTRAM) 50 MG tablet Take 50 mg by mouth every 6 (six) hours as needed. Reported on 11/24/2015    Historical Provider, MD  venlafaxine (EFFEXOR) 25 MG tablet Take 25 mg by mouth 2 (two) times daily.    Historical Provider, MD   BP 133/47 mmHg  Pulse 82  Temp(Src) 99.1 F (37.3 C) (Oral)  Resp 18  SpO2 100% Physical Exam  Constitutional: She is oriented to person, place, and time. She appears well-developed and well-nourished. No distress.  Obese Caucasian female, in no acute discomfort.  HENT:  Head: Atraumatic.  Mouth/Throat: Oropharynx is clear and moist.  Scalp with excoriation marks but no signs of infection. No significant hair loss  Eyes: Conjunctivae are normal.  Neck:  Neck supple.  Cardiovascular: Normal rate and regular rhythm.   Pulmonary/Chest: Effort normal and breath sounds normal.  Abdominal: Soft. There is no tenderness.  Neurological: She is alert and oriented to person, place, and time. She has normal strength. No cranial nerve deficit or sensory deficit. She displays a negative Romberg sign. Coordination and gait normal. GCS eye subscore is 4. GCS verbal subscore is 5. GCS motor subscore is 6.  Skin: Rash (multiple ulcerations noted to face and body consistent with self picking behavior.  No signs of infection. ) noted. There is pallor.  Psychiatric: She has a normal mood and affect. Her speech is normal and behavior is normal. Thought content is not paranoid. She expresses no homicidal  and no suicidal ideation.  Nursing note and vitals reviewed.   ED Course  Procedures (including critical care time) Labs Review Labs Reviewed  CBC WITH DIFFERENTIAL/PLATELET - Abnormal; Notable for the following:    WBC 10.7 (*)    RBC 3.82 (*)    Hemoglobin 6.6 (*)    HCT 23.8 (*)    MCV 62.3 (*)    MCH 17.3 (*)    MCHC 27.7 (*)    RDW 18.3 (*)    Platelets 456 (*)    Neutro Abs 8.1 (*)    Monocytes Absolute 1.1 (*)    All other components within normal limits  COMPREHENSIVE METABOLIC PANEL - Abnormal; Notable for the following:    Potassium 2.4 (*)    ALT 11 (*)    All other components within normal limits  RETICULOCYTES - Abnormal; Notable for the following:    RBC. 3.82 (*)    All other components within normal limits  POC OCCULT BLOOD, ED - Abnormal; Notable for the following:    Fecal Occult Bld POSITIVE (*)    All other components within normal limits  TROPONIN I  VITAMIN B12  FOLATE  IRON AND TIBC  FERRITIN  TYPE AND SCREEN  PREPARE RBC (CROSSMATCH)  ABO/RH    Imaging Review No results found. I have personally reviewed and evaluated these images and lab results as part of my medical decision-making.   EKG  Interpretation   Date/Time:  Tuesday November 24 2015 17:46:03 EST Ventricular Rate:  80 PR Interval:  155 QRS Duration: 89 QT Interval:  412 QTC Calculation: 475 R Axis:   49 Text Interpretation:  Sinus rhythm Low voltage, precordial leads  Nonspecific T abnormalities, lateral leads new since last tracing  Confirmed by KNAPP  MD-J, JON ([id number]) on 11/24/2015 6:01:21 PM      MDM   Final diagnoses:  Symptomatic anemia  Hypokalemia  GI bleed due to NSAIDs    BP 133/47 mmHg  Pulse 82  Temp(Src) 99.1 F (37.3 C) (Oral)  Resp 18  SpO2 100%   5:23 PM Patient presents with lightheadedness, dizziness, shortness of breath, and generalized fatigue consistence with symptomatic anemia.  She has a documented Hgb of 6.7.  I will obtain type and cross and will initiate blood transfusion.    She also c/o having itchy scalp and sensation of bugs crawling on her scalp.  This may be related to her states of anemia.  However, once she has been medically treated she may need psychiatric evaluation.    5:27 PM Pt has normal orthostatic vital sign.  She does have a positive fecal occult blood test although her stool appears normal. She admits to taking naproxen twice daily on a regular basis for her fibromyalgia pain which I suspect may have caused the GI bleeding. She does not have any active abdominal pain at this time. She mentioned she had had a bowel movement in a week but denies having constipation and states she is able to pass flatus. Low suspicion for bowel obstruction she does not have any severe pain or nausea vomiting. She has multiple skin lesions throughout body from self picking including her scalp. At this time, patient will be admitted for symptomatic anemia. Discussed with Dr. Lynelle Doctor.  7:22 PM Patient has normal total bili. However her potassium level is 2.4 without any acute EKG changes. Supplementation given.  Appreciate consultation from [facility] Dr. Rhona Leavens who agrees to  admit pt to med surg under his care.  Pt currently receiving blood transfusion.  She is otherwise hemodynamically stable.    CRITICAL CARE Performed by: Fayrene Helper Total critical care time: 30 minutes Critical care time was exclusive of separately billable procedures and treating other patients. Critical care was necessary to treat or prevent imminent or life-threatening deterioration. Critical care was time spent personally by me on the following activities: development of treatment plan with patient and/or surrogate as well as nursing, discussions with consultants, evaluation of patient's response to treatment, examination of patient, obtaining history from patient or surrogate, ordering and performing treatments and interventions, ordering and review of laboratory studies, ordering and review of radiographic studies, pulse oximetry and re-evaluation of patient's condition.   Fayrene Helper, PA-C 11/24/15 1926  Linwood Dibbles, MD 11/24/15 2019

## 2015-11-24 NOTE — Progress Notes (Signed)
Chief Complaint:  Chief Complaint  Patient presents with  . Dizziness    x2 weeks  . Passed Out    x2 weeks    HPI: Tracey Estrada is a 50 y.o. female who reports to [facility] today complaining of Recurrent episodes of syncope. The story is difficult and seems to change but apparently she had a syncopal episode a couple of weeks ago and then again last night. This was not associated with any chest pain or palpitations area patient has a history of MS history of major depressive disorder as well as a mass. She is currently under the care of Dr.Akintayo and Dr. Mikeal Hawthorne. 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 She also is fearful she has multiple bugs biting her on her scalp and trunk. His areas have been, excoriated and needed to be treated with antibiotics recently.  Past Medical History  Diagnosis Date  . Panic disorder   . MS (multiple sclerosis) (HCC)   . Hypertension   . COPD (chronic obstructive pulmonary disease) (HCC)   . Major depressive disorder (HCC)   . Nonalcoholic fatty liver disease   . GERD (gastroesophageal reflux disease)   . Fibromyalgia   . PTSD (post-traumatic stress disorder)   . Allergy    Past Surgical History  Procedure Laterality Date  . Tonsillectomy    . Cesarean section    . Hemorrhoid surgery    . Wrist surgery    . Tubal ligation    . Abdominal hysterectomy     Social History   Social History  . Marital Status: Divorced    Spouse Name: N/A  . Number of Children: N/A  . Years of Education: N/A   Social History Main Topics  . Smoking status: Former Games developer  . Smokeless tobacco: Never Used  . Alcohol Use: No  . Drug Use: No  . Sexual Activity: Not Asked   Other Topics Concern  . None   Social History Narrative   Family History  Problem Relation Age of Onset  . Hypertension Mother   . Heart failure Mother   . Hypertension Sister   . Stroke Sister     Allergies  Allergen Reactions  . Other     Anything with steroid causes psychosis   Prior to Admission medications   Medication Sig Start Date End Date Taking? Authorizing Provider  baclofen (LIORESAL) 10 MG tablet Take 10-20 mg by mouth 2 (two) times daily. 10 mg in am and 20 mg at bed time   Yes Historical Provider, MD  buPROPion (WELLBUTRIN XL) 300 MG 24 hr tablet Take 150 mg by mouth daily.    Yes Historical Provider, MD  clonazePAM (KLONOPIN) 1 MG tablet Take 1-1.5 mg by mouth 2 (two) times daily as needed. Reported on 11/24/2015   Yes Historical Provider, MD  estradiol (ESTRACE) 0.5 MG tablet Take 0.5 mg by mouth daily.   Yes Historical Provider, MD  hydrochlorothiazide (HYDRODIURIL) 25 MG tablet Take 25 mg by mouth daily. Reported on 11/24/2015   Yes Historical Provider, MD  meclizine (ANTIVERT) 50 MG tablet Take 1 tablet (50 mg total) by mouth 3 (three) times daily as needed. 09/03/12  Yes Jerelyn Scott, MD  methocarbamol (ROBAXIN) 500 MG tablet Take 500 mg by mouth at bedtime.   Yes Historical Provider, MD  metoCLOPramide (REGLAN) 10 MG tablet Take 10 mg by mouth 4 (four) times  daily.   Yes Historical Provider, MD  Multiple Vitamin (MULTIVITAMIN WITH MINERALS) TABS Take 1 tablet by mouth daily.   Yes Historical Provider, MD  naproxen (NAPROSYN) 250 MG tablet Take by mouth 2 (two) times daily with a meal.   Yes Historical Provider, MD  pantoprazole (PROTONIX) 40 MG tablet Take 40 mg by mouth 2 (two) times daily. Reported on 11/24/2015   Yes Historical Provider, MD  topiramate (TOPAMAX) 100 MG tablet Take 100 mg by mouth 2 (two) times daily.   Yes Historical Provider, MD  venlafaxine (EFFEXOR) 25 MG tablet Take 25 mg by mouth 2 (two) times daily.   Yes Historical Provider, MD  cyclobenzaprine (FLEXERIL) 10 MG tablet Take 10 mg by mouth 3 (three) times daily as needed. Reported on 11/24/2015    Historical Provider, MD  docusate sodium (COLACE) 100 MG capsule Take 100 mg by mouth 2 (two)  times daily. Reported on 11/24/2015    Historical Provider, MD  meloxicam (MOBIC) 15 MG tablet Take 15 mg by mouth daily. Reported on 11/24/2015    Historical Provider, MD  ondansetron (ZOFRAN) 8 MG tablet Take 1 tablet (8 mg total) by mouth every 8 (eight) hours as needed for nausea. Patient not taking: Reported on 11/24/2015 09/03/12   Jerelyn Scott, MD  temazepam (RESTORIL) 15 MG capsule Take 15-30 mg by mouth at bedtime as needed. Reported on 11/24/2015    Historical Provider, MD  traMADol (ULTRAM) 50 MG tablet Take 50 mg by mouth every 6 (six) hours as needed. Reported on 11/24/2015    Historical Provider, MD     ROS: The patient denies fevers, chills, night sweats, unintentional weight loss, chest pain, palpitations, wheezing, dyspnea on exertion, nausea, vomiting, abdominal pain, dysuria, hematuria, melena, numbness, weakness, or tingling.   All other systems have been reviewed and were otherwise negative with the exception of those mentioned in the HPI and as above.    PHYSICAL EXAM: Filed Vitals:   11/24/15 1427  BP: 118/88  Pulse: 107  Temp: 99.3 F (37.4 C)  Resp: 18   Body mass index is 32.97 kg/(m^2).   General: Alert, no acute distress she is oriented. HEENT:  Normocephalic, atraumatic, oropharynx patent. Eye: Nonie Hoyer [facility] Cardiovascular:  Regular rate and rhythm, no rubs murmurs or gallops.  No Carotid bruits, radial pulse intact. No pedal edema.  Respiratory: Clear to auscultation bilaterally.  No wheezes, rales, or rhonchi.  No cyanosis, no use of accessory musculature Abdominal: No organomegaly, abdomen is soft and non-tender, positive bowel sounds.  No masses. Musculoskeletal: Gait intact. No edema, tenderness Skin: There are multiple excoriated areas on the scalp and extremities and trunk. Neurologic: Facial musculature symmetric. Psychiatric: Patient acts appropriately throughout our interaction. Lymphatic: No cervical or submandibular  lymphadenopathy    LABS: Results for orders placed or performed in visit on 11/24/15  POCT CBC  Result Value Ref Range   WBC 12.2 (A) 4.6 - 10.2 K/uL   Lymph, poc 2.3 0.6 - 3.4   POC LYMPH PERCENT 19.1 10 - 50 %L   MID (cbc) 0.6 0 - 0.9   POC MID % 4.7 0 - 12 %M   POC Granulocyte 9.3 (A) 2 - 6.9   Granulocyte percent 76.2 37 - 80 %G   RBC 3.69 (A) 4.04 - 5.48 M/uL   Hemoglobin 6.7 (A) 12.2 - 16.2 g/dL   HCT, POC 16.1 (A) 09.6 - 47.9 %   MCV 58.2 (A) 80 - 97 fL   MCH, POC 18.1 (  A) 27 - 31.2 pg   MCHC 31.1 (A) 31.8 - 35.4 g/dL   RDW, POC 96.0 %   Platelet Count, POC 450 (A) 142 - 424 K/uL   MPV 6.9 0 - 99.8 fL  POCT glucose (manual entry)  Result Value Ref Range   POC Glucose 109 (A) 70 - 99 mg/dl     EKG/XRAY:   Primary read interpreted by Dr. Cleta Alberts at [facility] There are some nonspecific ST T changes no acute changes are seen.   ASSESSMENT/PLAN: Patient has a complicated history. She appears to have an extreme microcytic anemia. She is status post hysterectomy and denies any active GI bleeding. She has a significant mental health history. 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 She has had by history 2 episodes of syncope. These were not associated with chest pain. I'm very concerned about her mix of medications. I did not feel safe sending her home without further evaluation. She has a 52 year old at home she cares for. She had no other family around. Patient sent to [facility] for further evaluation medical clearance and psychiatric evaluation.I personally performed the services described in this documentation, which was scribed in my presence. The recorded information has been reviewed and is accurate. I called Dr. Gloris Manchester office but could not get an answer there there were some mild T-wave changes on her EKG.Michaell Cowing sideeffects, risk and benefits, and alternatives of medications d/w patient. Patient is aware that all medications have potential sideeffects and we  are unable to predict every sideeffect or drug-drug interaction that may occur.  Lesle Chris MD 11/24/2015 3:49 PM

## 2015-11-24 NOTE — ED Notes (Signed)
Called unit to give report and was put on hold.

## 2015-11-24 NOTE — H&P (Signed)
[facility] History and Physical  PAULANN DELUCCA TGY:[id number] DOB: 01-13-1966 DOA: 11/24/2015  Referring physician: Emergency Department PCP: Lonia Blood, MD  Specialists:   Chief Complaint: Syncope, symptomatic anemia  HPI: Tracey Estrada is a 50 y.o. female with a hx of panic disorder, MS, COPD, depression, fibromyalgia who presents to the ED with complaints of increasing generalized weakness and lightheadedness with resultant syncope syncope one day prior to visit. Patient was seen at urgent care on the day of admission. During work up, patient was found to have hgb of 6.7. She was referred to ED for further work up. In the ED, patient was found to be heme pos. No signs of gross blood or melena. On further questioning, patient admits to taking regular doses of NSAIDs for hx of fibromyalgia. In the ED, 2 units of prbc's were ordered. Hospitalist service consulted for admission.  On further questioning, pt became more symptomatic on standing with dizziness and weakness.  Review of Systems:  Review of Systems  Constitutional: Positive for malaise/fatigue. Negative for fever and chills.  HENT: Negative for ear pain, hearing loss and tinnitus.   Eyes: Negative for pain and discharge.  Respiratory: Negative for sputum production and shortness of breath.   Cardiovascular: Negative for orthopnea and leg swelling.  Gastrointestinal: Negative for abdominal pain and constipation.  Genitourinary: Negative for frequency and hematuria.  Musculoskeletal: Negative for falls and neck pain.  Skin: Positive for itching.  Neurological: Positive for loss of consciousness. Negative for tremors and seizures.  Psychiatric/Behavioral: Negative for hallucinations and memory loss.     Past Medical History  Diagnosis Date  . Panic disorder   . MS (multiple sclerosis) (HCC)   . Hypertension   . COPD (chronic obstructive pulmonary disease) (HCC)   . Major depressive disorder (HCC)   . Nonalcoholic  fatty liver disease   . GERD (gastroesophageal reflux disease)   . Fibromyalgia   . PTSD (post-traumatic stress disorder)   . Allergy    Past Surgical History  Procedure Laterality Date  . Tonsillectomy    . Cesarean section    . Hemorrhoid surgery    . Wrist surgery    . Tubal ligation    . Abdominal hysterectomy     Social History:  reports that she has quit smoking. She has never used smokeless tobacco. She reports that she does not drink alcohol or use illicit drugs.  where does patient live--home, ALF, SNF? and with whom if at home?  Can patient participate in ADLs?  Allergies  Allergen Reactions  . Other     Anything with steroid causes psychosis    Family History  Problem Relation Age of Onset  . Hypertension Mother   . Heart failure Mother   . Hypertension Sister   . Stroke Sister      Prior to Admission medications   Medication Sig Start Date End Date Taking? Authorizing Provider  baclofen (LIORESAL) 10 MG tablet Take 10-20 mg by mouth 2 (two) times daily. 10 mg in am and 20 mg at bed time   Yes Historical Provider, MD  buPROPion (WELLBUTRIN SR) 150 MG 12 hr tablet Take 150 mg by mouth daily.   Yes Historical Provider, MD  clonazePAM (KLONOPIN) 1 MG tablet Take 1 mg by mouth 2 (two) times daily. Reported on 11/24/2015   Yes Historical Provider, MD  estradiol (ESTRACE) 0.5 MG tablet Take 0.5 mg by mouth daily.   Yes Historical Provider, MD  hydrochlorothiazide (HYDRODIURIL) 25 MG tablet Take  25 mg by mouth daily. Reported on 11/24/2015   Yes Historical Provider, MD  lubiprostone (AMITIZA) 24 MCG capsule Take 24 mcg by mouth daily with breakfast.   Yes Historical Provider, MD  methocarbamol (ROBAXIN) 500 MG tablet Take 500 mg by mouth at bedtime.   Yes Historical Provider, MD  metoCLOPramide (REGLAN) 10 MG tablet Take 10 mg by mouth 4 (four) times daily.   Yes Historical Provider, MD  naproxen sodium (ANAPROX) 220 MG tablet Take 220 mg by mouth 2 (two) times daily.    Yes Historical Provider, MD  pantoprazole (PROTONIX) 40 MG tablet Take 40 mg by mouth 2 (two) times daily. Reported on 11/24/2015   Yes Historical Provider, MD  prazosin (MINIPRESS) 5 MG capsule Take 5 mg by mouth at bedtime.   Yes Historical Provider, MD  topiramate (TOPAMAX) 100 MG tablet Take 100 mg by mouth at bedtime.    Yes Historical Provider, MD  Venlafaxine HCl (EFFEXOR PO) Take 225 tablets by mouth daily.   Yes Historical Provider, MD  meclizine (ANTIVERT) 50 MG tablet Take 1 tablet (50 mg total) by mouth 3 (three) times daily as needed. Patient not taking: Reported on 11/24/2015 09/03/12   Jerelyn Scott, MD  ondansetron (ZOFRAN) 8 MG tablet Take 1 tablet (8 mg total) by mouth every 8 (eight) hours as needed for nausea. Patient not taking: Reported on 11/24/2015 09/03/12   Jerelyn Scott, MD   Physical Exam: Filed Vitals:   11/24/15 1638  BP: 133/47  Pulse: 82  Temp: 99.1 F (37.3 C)  TempSrc: Oral  Resp: 18  SpO2: 100%     General:  Awake, in nad  Eyes: PERRL B  ENT: membranes moist, dentition fair  Neck: trachea midline, neck supple  Cardiovascular: regular, s1, s2  Respiratory: normal resp effort, no wheezing  Abdomen: soft,nondistneded  Skin: normal skin turgor no abnormal skin lesions seen  Musculoskeletal: perfused, no clubbing  Psychiatric: mood/affect normal // no auditory/visual hallucinations  Neurologic: cn2-12 grossly intact, strength/sensation intact  Labs on Admission:  Basic Metabolic Panel:  Recent Labs Lab 11/24/15 1802  NA 135  K 2.4*  CL 101  CO2 22  GLUCOSE 94  BUN 10  CREATININE 0.73  CALCIUM 8.9   Liver Function Tests:  Recent Labs Lab 11/24/15 1802  AST 16  ALT 11*  ALKPHOS 103  BILITOT 0.6  PROT 7.2  ALBUMIN 4.2   No results for input(s): LIPASE, AMYLASE in the last 168 hours. No results for input(s): AMMONIA in the last 168 hours. CBC:  Recent Labs Lab 11/24/15 1559 11/24/15 1802  WBC 12.2* 10.7*  NEUTROABS   --  8.1*  HGB 6.7* 6.6*  HCT 21.5* 23.8*  MCV 58.2* 62.3*  PLT  --  456*   Cardiac Enzymes:  Recent Labs Lab 11/24/15 1802  TROPONINI <0.03    BNP (last 3 results) No results for input(s): BNP in the last 8760 hours.  ProBNP (last 3 results) No results for input(s): PROBNP in the last 8760 hours.  CBG: No results for input(s): GLUCAP in the last 168 hours.  Radiological Exams on Admission: No results found.  Assessment/Plan Principal Problem:   Acute blood loss anemia Active Problems:   Depression   Multiple sclerosis (HCC)   Symptomatic anemia   1. Syncope 1. Very likely secondary to presenting acute blood loss anemia 2. Stable at present 3. Admit to med-surg 2. Acute blood loss anemia 1. Pt heme pos in ED 2. 2 units of prbc's ordered,  pending 3. Follow serial hgb and cont to transfuse as needed to keep hgb >7 4. Given hx of heavy nsaid use, suspect possible upper GI source 5. Continue on BID IV PPI 6. Clears for now and NPO after midnight 7. No prior endoscopies. Does not follow GI as outpatient 8. As pt is stable at present, would consult GI in AM 3. Depression 1. Stable 4. MS 1. Stable 2. Cont meds 5. DVT prophylaxis 1. SCD's  Code Status: Full Family Communication: Pt in room Disposition Plan: admit to med-surg   Jerald Kief [facility] Pager [phone]  If 7PM-7AM, please contact night-coverage [email or web address] Password [facility] 11/24/2015, 7:27 PM

## 2015-11-24 NOTE — ED Notes (Addendum)
Potassium 2.4-notified Elsie Ra primary RN, and Fayrene Helper EDPA

## 2015-11-24 NOTE — ED Notes (Signed)
Pt states she passed out yesterday evening and states she feels like bugs are crawling all over her head. Pt went to [place] urgent care and states her iron level was low so she was sent here by EMS for further evaluation. Pt arrived alert and oriented and in no acute distress.

## 2015-11-24 NOTE — ED Notes (Signed)
Report given to nurse on 3 west

## 2015-11-25 ENCOUNTER — Encounter (HOSPITAL_COMMUNITY)
Admission: EM | Disposition: A | Discharge status: Home / Self Care | Payer: Self-pay | Source: Home / Self Care | Attending: Family Medicine

## 2015-11-25 ENCOUNTER — Encounter (HOSPITAL_COMMUNITY): Payer: Self-pay

## 2015-11-25 DIAGNOSIS — R55 Syncope and collapse: Principal | ICD-10-CM

## 2015-11-25 DIAGNOSIS — R195 Other fecal abnormalities: Secondary | ICD-10-CM

## 2015-11-25 DIAGNOSIS — D509 Iron deficiency anemia, unspecified: Secondary | ICD-10-CM | POA: Insufficient documentation

## 2015-11-25 HISTORY — PX: ESOPHAGOGASTRODUODENOSCOPY: SHX5428

## 2015-11-25 LAB — BASIC METABOLIC PANEL
ANION GAP: 10 (ref 5–15)
BUN: <5 mg/dL — ABNORMAL LOW (ref 6–20)
CHLORIDE: 108 mmol/L (ref 101–111)
CO2: 23 mmol/L (ref 22–32)
Calcium: 8.9 mg/dL (ref 8.9–10.3)
Creatinine, Ser: 0.69 mg/dL (ref 0.44–1.00)
GFR calc Af Amer: >60 mL/min (ref >60)
GFR calc non Af Amer: >60 mL/min (ref >60)
GLUCOSE: 94 mg/dL (ref 65–99)
POTASSIUM: 3.1 mmol/L — AB (ref 3.5–5.1)
Sodium: 141 mmol/L (ref 135–145)

## 2015-11-25 LAB — COMPREHENSIVE METABOLIC PANEL
ALBUMIN: 3.4 g/dL — AB (ref 3.5–5.0)
ALT: 11 U/L — ABNORMAL LOW (ref 14–54)
ANION GAP: 8 (ref 5–15)
AST: 14 U/L — AB (ref 15–41)
Alkaline Phosphatase: 90 U/L (ref 38–126)
BUN: 8 mg/dL (ref 6–20)
CO2: 23 mmol/L (ref 22–32)
Calcium: 8.3 mg/dL — ABNORMAL LOW (ref 8.9–10.3)
Chloride: 105 mmol/L (ref 101–111)
Creatinine, Ser: 0.69 mg/dL (ref 0.44–1.00)
GFR calc Af Amer: >60 mL/min (ref >60)
GLUCOSE: 111 mg/dL — AB (ref 65–99)
POTASSIUM: 2.6 mmol/L — AB (ref 3.5–5.1)
Sodium: 136 mmol/L (ref 135–145)
TOTAL PROTEIN: 6.1 g/dL — AB (ref 6.5–8.1)
Total Bilirubin: 0.7 mg/dL (ref 0.3–1.2)

## 2015-11-25 LAB — IRON AND TIBC
%SAT: 3 % — AB (ref 11–50)
IRON: 11 ug/dL — AB (ref 45–160)
TIBC: 377 ug/dL (ref 250–450)
UIBC: 366 ug/dL (ref 125–400)

## 2015-11-25 LAB — CBC
HEMATOCRIT: 24.3 % — AB (ref 36.0–46.0)
HEMATOCRIT: 30.4 % — AB (ref 36.0–46.0)
HEMOGLOBIN: 7.3 g/dL — AB (ref 12.0–15.0)
Hemoglobin: 9.1 g/dL — ABNORMAL LOW (ref 12.0–15.0)
MCH: 20.1 pg — ABNORMAL LOW (ref 26.0–34.0)
MCH: 20.7 pg — ABNORMAL LOW (ref 26.0–34.0)
MCHC: 30 g/dL (ref 30.0–36.0)
MCHC: 29.9 g/dL — ABNORMAL LOW (ref 30.0–36.0)
MCV: 66.9 fL — AB (ref 78.0–100.0)
MCV: 69.2 fL — AB (ref 78.0–100.0)
Platelets: 367 10*3/uL (ref 150–400)
Platelets: 387 10*3/uL (ref 150–400)
RBC: 3.63 MIL/uL — ABNORMAL LOW (ref 3.87–5.11)
RBC: 4.39 MIL/uL (ref 3.87–5.11)
RDW: 22.3 % — AB (ref 11.5–15.5)
RDW: 22.2 % — ABNORMAL HIGH (ref 11.5–15.5)
WBC: 9.1 10*3/uL (ref 4.0–10.5)
WBC: 9.8 10*3/uL (ref 4.0–10.5)

## 2015-11-25 LAB — MAGNESIUM: Magnesium: 1.8 mg/dL (ref 1.7–2.4)

## 2015-11-25 SURGERY — EGD (ESOPHAGOGASTRODUODENOSCOPY)
Anesthesia: Moderate Sedation

## 2015-11-25 MED ORDER — MIDAZOLAM HCL 10 MG/2ML IJ SOLN
INTRAMUSCULAR | Status: DC | PRN
  Administered 2015-11-25 (×5): 2 mg via INTRAVENOUS

## 2015-11-25 MED ORDER — POTASSIUM CHLORIDE 10 MEQ/100ML IV SOLN
10.0000 meq | INTRAVENOUS | Status: DC
  Administered 2015-11-25: 10 meq via INTRAVENOUS
  Filled 2015-11-25 (×4): qty 100

## 2015-11-25 MED ORDER — MIDAZOLAM HCL 5 MG/ML IJ SOLN
INTRAMUSCULAR | Status: AC
  Filled 2015-11-25: qty 2

## 2015-11-25 MED ORDER — DIPHENHYDRAMINE HCL 50 MG/ML IJ SOLN
INTRAMUSCULAR | Status: AC
  Filled 2015-11-25: qty 1

## 2015-11-25 MED ORDER — FENTANYL CITRATE (PF) 100 MCG/2ML IJ SOLN
INTRAMUSCULAR | Status: AC
  Filled 2015-11-25: qty 2

## 2015-11-25 MED ORDER — POTASSIUM CHLORIDE CRYS ER 20 MEQ PO TBCR
40.0000 meq | EXTENDED_RELEASE_TABLET | ORAL | Status: AC
  Administered 2015-11-25 (×2): 40 meq via ORAL
  Filled 2015-11-25 (×2): qty 2

## 2015-11-25 MED ORDER — FENTANYL CITRATE (PF) 100 MCG/2ML IJ SOLN
INTRAMUSCULAR | Status: DC | PRN
  Administered 2015-11-25 (×4): 25 ug via INTRAVENOUS

## 2015-11-25 MED ORDER — DIPHENHYDRAMINE HCL 50 MG/ML IJ SOLN
INTRAMUSCULAR | Status: DC | PRN
  Administered 2015-11-25 (×2): 25 mg via INTRAVENOUS

## 2015-11-25 MED ORDER — BUTAMBEN-TETRACAINE-BENZOCAINE 2-2-14 % EX AERO
INHALATION_SPRAY | CUTANEOUS | Status: DC | PRN
  Administered 2015-11-25: 2 via TOPICAL

## 2015-11-25 NOTE — Progress Notes (Signed)
PROGRESS NOTE  Tracey Estrada ZOX:[id number] DOB: Mar 07, 1966 DOA: 11/24/2015 PCP: Lonia Blood, MD  Summary: 50 year old woman presented to urgent care with complaints of syncope, lightheadedness, generalized weakness; found to have hemoglobin 6.7 and was referred to the emergency department.  Assessment/Plan: 1. Syncope secondary to symptomatic blood loss. 2. GI bleed, suspect upper in context of daily NSAID use for fibromyalgia. PMH hysterectomy. No active bleeding. 3. Acute blood loss anemia, iron deficient, symptomatic. Status post 2 units red blood cells. Most recent CBC was drawn between units. 4. Hypokalemia. Likely secondary to hydrochlorothiazide, discontinue. Magnesium normal. 5. Delusional parasitosis? No evidence of infection at this point. Follow clinically. 6. PMH panic disorder, depression, fibromyalgia, PTSD, multiple sclerosis, COPD   Appears stable. Hemodynamics are stable. Repeat CBC. Transfuse for hemoglobin less than 8.  Trend hemoglobin, continue PPI twice daily, consult gastroenterology. No history of endoscopy.  Replace potassium. BMP and serum magnesium in the morning.  Code Status: full code DVT prophylaxis: SCDs Family Communication: none present Disposition Plan: home  Brendia Sacks, MD  [facility]  Pager [phone] If 7PM-7AM, please contact night-coverage at [email or web address], password [facility] 11/25/2015, 7:36 AM  LOS: 1 day   Consultants:  GI  Procedures:    Antibiotics:    HPI/Subjective: No bleeding, no pain. Complains of hair falling out and scalp itching. Reports history of picking skin scratching it has also been scratching scalp.  Objective: Filed Vitals:   11/24/15 2345 11/25/15 0311 11/25/15 0340 11/25/15 0554  BP: 101/48 117/60 111/60 110/83  Pulse: 96 90 90 97  Temp: 99.3 F (37.4 C) 99 F (37.2 C) 98.7 F (37.1 C) 98.8 F (37.1 C)  TempSrc: Oral Oral Oral Oral  Resp: 16 16 16 16   Height:      Weight:      SpO2:  100% 100% 100% 100%    Intake/Output Summary (Last 24 hours) at 11/25/15 0736 Last data filed at 11/24/15 2100  Gross per 24 hour  Intake    240 ml  Output      0 ml  Net    240 ml     Filed Weights   11/24/15 2104  Weight: 93 kg (205 lb 0.4 oz)    Exam:    General:  Appears calm and comfortable Cardiovascular: RRR, no m/r/g. No LE edema. Respiratory: CTA bilaterally, no w/r/r. Normal respiratory effort. Skin: no rash noted. Some abrasions both arms. Scalp without rash or some nodular lesions noted throughout the scalp. Psychiatric: grossly normal mood and affect, speech fluent and appropriate  New data reviewed:  Potassium 2.6. Remainder CMP unremarkable.  Hemoglobin up to 7.3.  Scheduled Meds: . baclofen  10 mg Oral Daily  . baclofen  20 mg Oral QHS  . buPROPion  150 mg Oral Daily  . clonazePAM  1 mg Oral BID  . estradiol  0.5 mg Oral Daily  . hydrochlorothiazide  25 mg Oral Daily  . lubiprostone  24 mcg Oral Q breakfast  . methocarbamol  500 mg Oral QHS  . metoCLOPramide  10 mg Oral QID  . pantoprazole (PROTONIX) IV  40 mg Intravenous Q12H  . potassium chloride  10 mEq Intravenous Q1 Hr x 4  . prazosin  5 mg Oral QHS  . topiramate  100 mg Oral QHS   Continuous Infusions: . sodium chloride 100 mL/hr (11/24/15 1937)    Principal Problem:   Acute blood loss anemia Active Problems:   Depression   Multiple sclerosis (HCC)   Anemia   Symptomatic  anemia   Time spent 20 minutes

## 2015-11-25 NOTE — Consult Note (Signed)
Referring Provider: No ref. provider found Primary Care Physician:  Lonia Blood, MD Primary Gastroenterologist:  Gentry Fitz  Reason for Consultation:  Presumed UGIB; symptomatic anemia  HPI: Tracey Estrada is a 50 y.o. female with hx of panic disorder, MS, COPD, depression, fibromyalgia, GERD, PTSD, HTN who presented to [facility] ED with complaints of increasing generalized weakness and lightheadedness with resultant fall at home (no syncope/LOC) one day prior to visit. Patient was seen at urgent care on the day of admission. During work up, patient was found to have Hgb of 6.7 grams. She was referred to ED for further work up. In the ED, patient was found to be heme positive but no overt bleeding.  She has been transfused with 2 units PRBC's.  Hgb 7.3 grams after one unit and no recheck since then.  She tells me that she has been generally fatigued, etc for about 2 weeks.  Denies any black stools.  Had one instance of streak of bright red blood on TP but no other bleeding.  Has hysterectomy and denies vaginal bleeding.  On further questioning, patient admits to taking regular doses of NSAIDs (Aleve 400 mg daily) for hx of fibromyalgia for about the past 6 months or so.  She takes pantoprazole 80 mg BID at home for reflux symptoms.  She tells me that she had a colonoscopy in [place] in her 30's that she thinks was normal.  K+ is low this AM and they are replacing that with IV/PO supplementation.  She is on pantoprazole 40 mg BID here.  Feeling somewhat better after receiving PRBC's.   Past Medical History  Diagnosis Date  . Panic disorder   . MS (multiple sclerosis) (HCC)   . Hypertension   . COPD (chronic obstructive pulmonary disease) (HCC)   . Major depressive disorder (HCC)   . Nonalcoholic fatty liver disease   . GERD (gastroesophageal reflux disease)   . Fibromyalgia   . PTSD (post-traumatic stress disorder)   . Allergy     Past Surgical History  Procedure Laterality Date  .  Tonsillectomy    . Cesarean section    . Hemorrhoid surgery    . Wrist surgery    . Tubal ligation    . Abdominal hysterectomy      Prior to Admission medications   Medication Sig Start Date End Date Taking? Authorizing Provider  baclofen (LIORESAL) 10 MG tablet Take 10-20 mg by mouth 2 (two) times daily. 10 mg in am and 20 mg at bed time   Yes Historical Provider, MD  buPROPion (WELLBUTRIN SR) 150 MG 12 hr tablet Take 150 mg by mouth daily.   Yes Historical Provider, MD  clonazePAM (KLONOPIN) 1 MG tablet Take 1 mg by mouth 2 (two) times daily. Reported on 11/24/2015   Yes Historical Provider, MD  estradiol (ESTRACE) 0.5 MG tablet Take 0.5 mg by mouth daily.   Yes Historical Provider, MD  hydrochlorothiazide (HYDRODIURIL) 25 MG tablet Take 25 mg by mouth daily. Reported on 11/24/2015   Yes Historical Provider, MD  lubiprostone (AMITIZA) 24 MCG capsule Take 24 mcg by mouth daily with breakfast.   Yes Historical Provider, MD  methocarbamol (ROBAXIN) 500 MG tablet Take 500 mg by mouth at bedtime.   Yes Historical Provider, MD  metoCLOPramide (REGLAN) 10 MG tablet Take 10 mg by mouth 4 (four) times daily.   Yes Historical Provider, MD  naproxen sodium (ANAPROX) 220 MG tablet Take 220 mg by mouth 2 (two) times daily.   Yes Historical  Provider, MD  pantoprazole (PROTONIX) 40 MG tablet Take 40 mg by mouth 2 (two) times daily. Reported on 11/24/2015   Yes Historical Provider, MD  prazosin (MINIPRESS) 5 MG capsule Take 5 mg by mouth at bedtime.   Yes Historical Provider, MD  topiramate (TOPAMAX) 100 MG tablet Take 100 mg by mouth at bedtime.    Yes Historical Provider, MD  Venlafaxine HCl (EFFEXOR PO) Take 225 tablets by mouth daily.   Yes Historical Provider, MD  meclizine (ANTIVERT) 50 MG tablet Take 1 tablet (50 mg total) by mouth 3 (three) times daily as needed. Patient not taking: Reported on 11/24/2015 09/03/12   Jerelyn Scott, MD  ondansetron (ZOFRAN) 8 MG tablet Take 1 tablet (8 mg total) by mouth  every 8 (eight) hours as needed for nausea. Patient not taking: Reported on 11/24/2015 09/03/12   Jerelyn Scott, MD    Current Facility-Administered Medications  Medication Dose Route Frequency Provider Last Rate Last Dose  . 0.9 %  sodium chloride infusion   Intravenous Continuous Jerald Kief, MD 100 mL/hr at 11/24/15 1937 100 mL/hr at 11/24/15 1937  . baclofen (LIORESAL) tablet 10 mg  10 mg Oral Daily Jerald Kief, MD      . baclofen (LIORESAL) tablet 20 mg  20 mg Oral QHS Jerald Kief, MD   20 mg at 11/24/15 2040  . buPROPion [facility] SR) 12 hr tablet 150 mg  150 mg Oral Daily Jerald Kief, MD   150 mg at 11/24/15 2041  . clonazePAM (KLONOPIN) tablet 1 mg  1 mg Oral BID Jerald Kief, MD   1 mg at 11/24/15 2041  . estradiol (ESTRACE) tablet 0.5 mg  0.5 mg Oral Daily Jerald Kief, MD   0.5 mg at 11/24/15 2041  . lubiprostone (AMITIZA) capsule 24 mcg  24 mcg Oral Q breakfast Jerald Kief, MD   24 mcg at 11/25/15 0759  . methocarbamol (ROBAXIN) tablet 500 mg  500 mg Oral QHS Jerald Kief, MD   500 mg at 11/24/15 2041  . metoCLOPramide (REGLAN) tablet 10 mg  10 mg Oral QID Jerald Kief, MD   10 mg at 11/24/15 2307  . pantoprazole (PROTONIX) injection 40 mg  40 mg Intravenous Q12H Jerald Kief, MD   40 mg at 11/24/15 2047  . potassium chloride SA (K-DUR,KLOR-CON) CR tablet 40 mEq  40 mEq Oral Q4H Standley Brooking, MD   40 mEq at 11/25/15 0759  . prazosin (MINIPRESS) capsule 5 mg  5 mg Oral QHS Jerald Kief, MD   5 mg at 11/24/15 2041  . topiramate (TOPAMAX) tablet 100 mg  100 mg Oral QHS Jerald Kief, MD   100 mg at 11/24/15 2306    Allergies as of 11/24/2015 - Review Complete 11/24/2015  Allergen Reaction Noted  . Other  09/03/2012    Family History  Problem Relation Age of Onset  . Hypertension Mother   . Heart failure Mother   . Hypertension Sister   . Stroke Sister     Social History   Social History  . Marital Status: Divorced    Spouse Name: N/A  .  Number of Children: N/A  . Years of Education: N/A   Occupational History  . Not on file.   Social History Main Topics  . Smoking status: Former Games developer  . Smokeless tobacco: Never Used  . Alcohol Use: No  . Drug Use: No  . Sexual Activity: Not on file  Other Topics Concern  . Not on file   Social History Narrative    Review of Systems: Ten point ROS is O/W negative except as mentioned in HPI.  Physical Exam: Vital signs in last 24 hours: Temp:  [98.7 F (37.1 C)-99.7 F (37.6 C)] 98.8 F (37.1 C) (03/01 0554) Pulse Rate:  [82-107] 97 (03/01 0554) Resp:  [12-18] 16 (03/01 0554) BP: (101-136)/(47-88) 110/83 mmHg (03/01 0554) SpO2:  [99 %-100 %] 100 % (03/01 0554) Weight:  [204 lb 3.2 oz (92.625 kg)-205 lb 0.4 oz (93 kg)] 205 lb 0.4 oz (93 kg) (02/28 2104)   General:  Alert, Well-developed, well-nourished, pleasant and cooperative in NAD Head:  Normocephalic and atraumatic. Eyes:  Sclera clear, no icterus.  Conjunctiva pink. Ears:  Normal auditory acuity. Mouth:  No deformity or lesions.   Lungs:  Clear throughout to auscultation.  No wheezes, crackles, or rhonchi.  Heart:  Regular rate and rhythm; no murmurs, clicks, rubs, or gallops. Abdomen:  Soft, non-distended.  BS present.  Non-tender. Rectal:  Deferred.  Heme positive in ED.  Msk:  Symmetrical without gross deformities. Pulses:  Normal pulses noted. Extremities:  Without clubbing or edema. Neurologic:  Alert and oriented x 4;  grossly normal neurologically. Skin:  Intact without significant lesions or rashes. Psych:  Alert and cooperative. Normal mood and affect.  Intake/Output from previous day: 02/28 0701 - 03/01 0700 In: 240 [P.O.:240] Out: -   Lab Results:  Recent Labs  11/24/15 1559 11/24/15 1802 11/24/15 1954 11/25/15 0323  WBC 12.2* 10.7*  --  9.1  HGB 6.7* 6.6* 6.3* 7.3*  HCT 21.5* 23.8*  --  24.3*  PLT  --  456*  --  367   BMET  Recent Labs  11/24/15 1802 11/25/15 0323  NA 135  136  K 2.4* 2.6*  CL 101 105  CO2 22 23  GLUCOSE 94 111*  BUN 10 8  CREATININE 0.73 0.69  CALCIUM 8.9 8.3*   LFT  Recent Labs  11/25/15 0323  PROT 6.1*  ALBUMIN 3.4*  AST 14*  ALT 11*  ALKPHOS 90  BILITOT 0.7   IMPRESSION:  *50 year old female who presented with fatigue, dizziness, feeling poorly for the past 2 weeks.  Found to have profound anemia with Hgb of 6.7 grams.  Was heme positive but no overt bleeding.  Has received 2 units PRBC's but most recent Hgb of 7.3 grams was after just one unit.  Using NSAID's daily at home for the past 6 months at least for fibromyalgia pain.  Rule out ulcer disease, etc. *Hypokalemia:  Potassium 2.6 this AM; they are replacing with IV and PO supplements and I will recheck STAT at 1400 today. *Fibromyalgia:  Taking Aleve for her pain regularly at home. *Chronic GERD:  On pantoprazole 80 mg BID at home.  PLAN: -EGD later today at 1500 pending that potassium is improved (rechecking STAT at 1400). -Continue pantoprazole 40 mg BID for now.   -Will need colonoscopy as well, which will likely be able to be completed as outpatient. -Monitor Hgb and transfuse further prn.   ZEHR, JESSICA D.  11/25/2015, 9:18 AM  Pager number [phone]  GI ATTENDING  History, laboratories, and ancillary data reviewed. Patient seen and examined. Agree with comprehensive consultation note as outlined above. Patient presents with profound symptomatic iron deficiency anemia and Hemoccult-positive stool. No active bleeding. Chronic NSAIDs. Stable post transfusion. She is status post hysterectomy. Not sure if she has a blood donor. Plan upper  endoscopy (hopefully today) to rule out significant upper GI mucosal lesion (ulcer). Pending the results of endoscopy may be you to go home. Would complete GI workup to include outpatient colonoscopy and possible small bowel enteroscopy if unrevealing.The nature of the procedure, as well as the risks, benefits, and alternatives were  carefully and thoroughly reviewed with the patient. Ample time for discussion and questions allowed. The patient understood, was satisfied, and agreed to proceed.  Wilhemina Bonito. Eda Keys., M.D. [facility] Division of Gastroenterology

## 2015-11-25 NOTE — Op Note (Signed)
[facility] [address] [place], [address]   ENDOSCOPY PROCEDURE REPORT  PATIENT: Tracey Estrada, Tracey Estrada  MR#: [id number] BIRTHDATE: 1966-07-09 , 49  yrs. old GENDER: female ENDOSCOPIST: Roxy Cedar, MD REFERRED BY:  [facility] PROCEDURE DATE:  11/25/2015 PROCEDURE:  EGD w/ biopsy ASA CLASS:     Class II INDICATIONS:  hemocult positive stool and iron deficiency anemia. MEDICATIONS: Fentanyl 100 mcg IV, Versed 10 mg IV, and Benadryl 50 mg IV TOPICAL ANESTHETIC: Cetacaine Spray  DESCRIPTION OF PROCEDURE: After the risks benefits and alternatives of the procedure were thoroughly explained, informed consent was obtained.  The [id number] ([id number]) endoscope was introduced through the mouth and advanced to the second portion of the duodenum , Without limitations.  The instrument was slowly withdrawn as the mucosa was fully examined.   EXAM: The esophagus and gastroesophageal junction were completely normal in appearance.  The stomach was entered and closely examined.The antrum, angularis, and lesser curvature were well visualized, including a retroflexed view of the cardia and fundus. The stomach wall was normally distensable.  The scope passed easily through the pylorus into the duodenumthe mucosa was normal throughout Duodenal biopsies obtained.  Retroflexed views revealed a hiatal hernia.     The scope was then withdrawn from the patient and the procedure completed. COMPLICATIONS: There were no immediate complications.  ENDOSCOPIC IMPRESSION: 1. GERD normal EGD status post duodenal biopsies  RECOMMENDATIONS: 1.  Await biopsy results 2.  My office will schedule a colonoscopy (outpatient [facility]) 3. Recommend iron sulfate 325 mg twice daily 4. Okay to go home from GI perspective. If she goes tonight, she will need a ride because of sedatives given during endoscopy Patient is aware of the above recommendations. Procedure report  provided to her as well  REPEAT EXAM:  eSigned:  Roxy Cedar, MD 11/25/2015 4:24 PM    CC:The Patient and Lesle Chris, MD

## 2015-11-25 NOTE — Progress Notes (Signed)
Pt is not being able to tolerate potassium IV. Reduced it to 19ml/hr from ordered 165ml/hr at this time. She prefers to take the oral tablet. Will pass on to day shift nurse in report

## 2015-11-26 DIAGNOSIS — F333 Major depressive disorder, recurrent, severe with psychotic symptoms: Secondary | ICD-10-CM | POA: Diagnosis not present

## 2015-11-26 DIAGNOSIS — K922 Gastrointestinal hemorrhage, unspecified: Secondary | ICD-10-CM

## 2015-11-26 DIAGNOSIS — D509 Iron deficiency anemia, unspecified: Secondary | ICD-10-CM

## 2015-11-26 LAB — CBC
HEMATOCRIT: 28.7 % — AB (ref 36.0–46.0)
HEMOGLOBIN: 8.4 g/dL — AB (ref 12.0–15.0)
MCH: 20.3 pg — ABNORMAL LOW (ref 26.0–34.0)
MCHC: 29.3 g/dL — ABNORMAL LOW (ref 30.0–36.0)
MCV: 69.3 fL — AB (ref 78.0–100.0)
Platelets: 380 10*3/uL (ref 150–400)
RBC: 4.14 MIL/uL (ref 3.87–5.11)
RDW: 21.9 % — AB (ref 11.5–15.5)
WBC: 9.3 10*3/uL (ref 4.0–10.5)

## 2015-11-26 LAB — TYPE AND SCREEN
ABO/RH(D): A POS
Antibody Screen: NEGATIVE
Unit division: 0
Unit division: 0

## 2015-11-26 LAB — BASIC METABOLIC PANEL
Anion gap: 5 (ref 5–15)
BUN: 6 mg/dL (ref 6–20)
CO2: 23 mmol/L (ref 22–32)
Calcium: 8.1 mg/dL — ABNORMAL LOW (ref 8.9–10.3)
Chloride: 112 mmol/L — ABNORMAL HIGH (ref 101–111)
Creatinine, Ser: 0.6 mg/dL (ref 0.44–1.00)
GFR calc Af Amer: >60 mL/min (ref >60)
Glucose, Bld: 111 mg/dL — ABNORMAL HIGH (ref 65–99)
POTASSIUM: 3.5 mmol/L (ref 3.5–5.1)
SODIUM: 140 mmol/L (ref 135–145)

## 2015-11-26 MED ORDER — TOPIRAMATE 25 MG PO TABS
50.0000 mg | ORAL_TABLET | Freq: Every day | ORAL | Status: DC
  Filled 2015-11-26: qty 2

## 2015-11-26 MED ORDER — PRAZOSIN HCL 2 MG PO CAPS: 2.0000 mg | ORAL_CAPSULE | Freq: Every day | ORAL | Status: AC

## 2015-11-26 MED ORDER — FERROUS SULFATE 325 (65 FE) MG PO TBEC: 325.0000 mg | DELAYED_RELEASE_TABLET | Freq: Two times a day (BID) | ORAL | Status: AC

## 2015-11-26 MED ORDER — TOPIRAMATE 50 MG PO TABS
50.0000 mg | ORAL_TABLET | Freq: Every day | ORAL | Status: AC
Start: 2015-11-26 — End: ?

## 2015-11-26 MED ORDER — PRAZOSIN HCL 2 MG PO CAPS
2.0000 mg | ORAL_CAPSULE | Freq: Every day | ORAL | Status: DC
  Filled 2015-11-26: qty 1

## 2015-11-26 NOTE — Progress Notes (Addendum)
PROGRESS NOTE  Tracey Estrada:[id number] DOB: [birth year] DOA: 11/24/2015 PCP: Lonia Blood, MD  Summary: 50 year old woman presented to urgent care with complaints of syncope, lightheadedness, generalized weakness; found to have hemoglobin 6.7 and was referred to the emergency department.  Assessment/Plan: 1. Syncope secondary to symptomatic blood loss. No recurrence. Asymptomatic now. 2. GI bleed, with microcytic anemia suggestive of subacute blood loss. Context of daily NSAID use for fibromyalgia. Past medical history hysterectomy. No active bleeding noted. EGD was unremarkable. Patient has been scheduled for outpatient colonoscopy. 3. Acute blood loss anemia, iron deficient, symptomatic. Status post 2 units red blood cells.  4. Hypokalemia. Likely secondary to hydrochlorothiazide, now discontinued. Magnesium normal. 5. Delusional parasitosis? This was questioned by urgent care. No evidence of infection at this point. Scalp lesion etiology unclear. Follow clinically. Psychiatry has recommended outpatient follow-up. 6. PMH panic disorder, depression, fibromyalgia, PTSD, multiple sclerosis, COPD   Stable. No bleeding. She has outpatient follow-up with gastroenterology arranged. Recommend iron. Follow-up with PCP in one week for repeat CBC. Return for bleeding or worsening of condition.  Code Status: full code DVT prophylaxis: SCDs Family Communication: none present Disposition Plan: home  Brendia Sacks, MD  [facility]  Pager [phone] If 7PM-7AM, please contact night-coverage at [email or web address], password [facility] 11/26/2015, 5:31 PM  LOS: 2 days   Consultants:  GI  Procedures:  Transfusion packed red blood cells 2 units   EGD ENDOSCOPIC IMPRESSION: 1. GERD normal EGD status post duodenal biopsies  Antibiotics:    HPI/Subjective: No bleeding. Overall seems to be doing well. Scalp seems to be doing okay at this point. Ambulating without difficulty. No  dizziness.   Objective: Filed Vitals:   11/25/15 1620 11/25/15 2121 11/26/15 0452 11/26/15 1300  BP: 136/65 148/67 106/55 135/71  Pulse: 88 79 82 80  Temp:  98.5 F (36.9 C) 98.3 F (36.8 C) 97.6 F (36.4 C)  TempSrc:  Oral Oral Oral  Resp: 18 18 18    Height:      Weight:      SpO2: 96% 98% 97% 100%    Intake/Output Summary (Last 24 hours) at 11/26/15 1731 Last data filed at 11/26/15 1300  Gross per 24 hour  Intake   1320 ml  Output      0 ml  Net   1320 ml     Filed Weights   11/24/15 2104  Weight: 93 kg (205 lb 0.4 oz)    Exam:    General:  Appears comfortable, calm. Cardiovascular: Regular rate and rhythm, no murmur, rub or gallop. No lower extremity edema. Respiratory: Clear to auscultation bilaterally, no wheezes, rales or rhonchi. Normal respiratory effort. Skin: Some excoriations noted on arms. Scalp dry with some scaly areas. Psychiatric: grossly normal mood and affect, speech fluent and appropriate  New data reviewed:  Potassium 3.5. Hemoglobin stable 8.4.   Scheduled Meds: . baclofen  10 mg Oral Daily  . baclofen  20 mg Oral QHS  . buPROPion  150 mg Oral Daily  . clonazePAM  1 mg Oral BID  . estradiol  0.5 mg Oral Daily  . lubiprostone  24 mcg Oral Q breakfast  . methocarbamol  500 mg Oral QHS  . metoCLOPramide  10 mg Oral QID  . pantoprazole (PROTONIX) IV  40 mg Intravenous Q12H  . prazosin  2 mg Oral QHS  . topiramate  50 mg Oral QHS   Continuous Infusions: . sodium chloride 100 mL/hr at 11/26/15 1232    Principal Problem:   MDD (major  depressive disorder), recurrent, severe, with psychosis (HCC) Active Problems:   Depression   Multiple sclerosis (HCC)   Anemia   Acute blood loss anemia   Symptomatic anemia   Anemia, iron deficiency   Heme positive stool

## 2015-11-26 NOTE — Consult Note (Signed)
[facility] Consult   Reason for Consult:  Depression with tactile hallucinations Referring Physician:  Dr. Sarajane Jews Patient Identification: Tracey Estrada MRN:  [id number] Principal Diagnosis: MDD (major depressive disorder), recurrent, severe, with psychosis ([place]) Diagnosis:   Patient Active Problem List   Diagnosis Date Noted  . MDD (major depressive disorder), recurrent, severe, with psychosis ([place]) [F33.3] 11/26/2015  . Anemia, iron deficiency [D50.9]   . Heme positive stool [R19.5]   . Depression [F32.9] 11/24/2015  . Multiple sclerosis ([place]) [G35] 11/24/2015  . Anemia [D64.9] 11/24/2015  . Acute blood loss anemia [D62] 11/24/2015  . Symptomatic anemia [D64.9] 11/24/2015    Total Time spent with patient: 1 hour  Subjective:   Tracey Estrada is a 50 y.o. female patient admitted with depression, anxiety and syncope episodes.Marland Kitchen  HPI:  Tracey Estrada is a 50 y.o. female with a hx of panic disorder, MS, COPD, depression, fibromyalgia who presents to the ED with complaints of increasing generalized weakness and lightheadedness with resultant syncope syncope one day prior to visit. Patient was seen at urgent care on the day of admission. During work up, patient was found to have hgb of 6.7. She was referred to ED for further work up. In the ED, patient was found to be heme pos. No signs of gross blood or melena. On further questioning, patient admits to taking regular doses of NSAIDs for hx of fibromyalgia. In the ED, 2 units of prbc's were ordered.   Patient seen for psychiatric consultation as patient complaints about tactile hallucinations, feels like bugs crawling under her scalp and eating away her hair, and also picking on her skin which is to multiple lacerations on her face. Patient stated she has been diagnosed with the major depressive disorder, anxiety disorder and a history of multiple sclerosis. Patient has been treated with several medication by primary  psychiatrist Dr. Darleene Cleaver. Patient stated that primary care physician believes that some of the medication making her dizziness and syncopal episodes. Patient is willing to decrease her Topamax which was initially started for mood stabilization and not to gain more weight with medication. Patient reportedly lost 44 pounds in 8 months and recently suffering with GI upset. 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 Patient denies active suicidal/homicidal ideation, intention or plans and did the same time reports she has been under significant stress. Patient reported she was never diagnosed with OCD.  Past Psychiatric History: Out patient treatment for depression, panic disorder and fibromyalgia and MS.  Risk to Self: Is patient at risk for suicide?: No Risk to Others:   Prior Inpatient Therapy:   Prior Outpatient Therapy:    Past Medical History:  Past Medical History  Diagnosis Date  . Panic disorder   . MS (multiple sclerosis) ([place])   . Hypertension   . COPD (chronic obstructive pulmonary disease) ([place])   . Major depressive disorder ([place])   . Nonalcoholic fatty liver disease   . GERD (gastroesophageal reflux disease)   . Fibromyalgia   . PTSD (post-traumatic stress disorder)   . Allergy     Past Surgical History  Procedure Laterality Date  . Tonsillectomy    . Cesarean section    . Hemorrhoid surgery    . Wrist surgery    . Tubal ligation    . Abdominal hysterectomy     Family History:  Family History  Problem Relation Age of Onset  . Hypertension Mother   .  Heart failure Mother   . Hypertension Sister   . Stroke Sister    Family Psychiatric  History: She has a family history of depression and anxiety, posttraumatic stress disorder and OCD. Social History:  History  Alcohol Use No     History  Drug Use No    Social History   Social History  . Marital Status: Divorced    Spouse Name: N/A  . Number of  Children: N/A  . Years of Education: N/A   Social History Main Topics  . Smoking status: Former Smoker    Quit date: 11/24/2012  . Smokeless tobacco: Never Used  . Alcohol Use: No  . Drug Use: No  . Sexual Activity: Yes   Other Topics Concern  . None   Social History Narrative   Additional Social History:    Allergies:   Allergies  Allergen Reactions  . Other     Anything with steroid causes psychosis    Labs:  Results for orders placed or performed during the hospital encounter of 11/24/15 (from the past 48 hour(s))  CBC with Differential/Platelet     Status: Abnormal   Collection Time: 11/24/15  6:02 PM  Result Value Ref Range   WBC 10.7 (H) 4.0 - 10.5 K/uL   RBC 3.82 (L) 3.87 - 5.11 MIL/uL   Hemoglobin 6.6 (LL) 12.0 - 15.0 g/dL    Comment: REPEATED TO VERIFY CRITICAL RESULT CALLED TO, READ BACK BY AND VERIFIED WITH: TERRI DOSTER,RN [id number] @ 1844 BY J SCOTTON    HCT 23.8 (L) 36.0 - 46.0 %   MCV 62.3 (L) 78.0 - 100.0 fL   MCH 17.3 (L) 26.0 - 34.0 pg   MCHC 27.7 (L) 30.0 - 36.0 g/dL   RDW 18.3 (H) 11.5 - 15.5 %   Platelets 456 (H) 150 - 400 K/uL   Neutrophils Relative % 76 %   Lymphocytes Relative 14 %   Monocytes Relative 10 %   Eosinophils Relative 0 %   Basophils Relative 0 %   Neutro Abs 8.1 (H) 1.7 - 7.7 K/uL   Lymphs Abs 1.5 0.7 - 4.0 K/uL   Monocytes Absolute 1.1 (H) 0.1 - 1.0 K/uL   Eosinophils Absolute 0.0 0.0 - 0.7 K/uL   Basophils Absolute 0.0 0.0 - 0.1 K/uL   RBC Morphology ELLIPTOCYTES   Comprehensive metabolic panel     Status: Abnormal   Collection Time: 11/24/15  6:02 PM  Result Value Ref Range   Sodium 135 135 - 145 mmol/L   Potassium 2.4 (LL) 3.5 - 5.1 mmol/L    Comment: CRITICAL RESULT CALLED TO, READ BACK BY AND VERIFIED WITH: T DOSTER AT 1918 ON 02.28.2017 BY NBROOKS    Chloride 101 101 - 111 mmol/L   CO2 22 22 - 32 mmol/L   Glucose, Bld 94 65 - 99 mg/dL   BUN 10 6 - 20 mg/dL   Creatinine, Ser 0.73 0.44 - 1.00 mg/dL   Calcium  8.9 8.9 - 10.3 mg/dL   Total Protein 7.2 6.5 - 8.1 g/dL   Albumin 4.2 3.5 - 5.0 g/dL   AST 16 15 - 41 U/L   ALT 11 (L) 14 - 54 U/L   Alkaline Phosphatase 103 38 - 126 U/L   Total Bilirubin 0.6 0.3 - 1.2 mg/dL   GFR calc non Af Amer >60 >60 mL/min   GFR calc Af Amer >60 >60 mL/min    Comment: (NOTE) The eGFR has been calculated using the CKD EPI equation. This  calculation has not been validated in all clinical situations. eGFR's persistently <60 mL/min signify possible Chronic Kidney Disease.    Anion gap 12 5 - 15  Type and screen     Status: None   Collection Time: 11/24/15  6:02 PM  Result Value Ref Range   ABO/RH(D) A POS    Antibody Screen NEG    Sample Expiration 11/27/2015    Unit Number [id number]    Blood Component Type RED CELLS,LR    Unit division 00    Status of Unit ISSUED,FINAL    Transfusion Status OK TO TRANSFUSE    Crossmatch Result Compatible    Unit Number [id number]    Blood Component Type RED CELLS,LR    Unit division 00    Status of Unit ISSUED,FINAL    Transfusion Status OK TO TRANSFUSE    Crossmatch Result Compatible   Vitamin B12     Status: Abnormal   Collection Time: 11/24/15  6:02 PM  Result Value Ref Range   Vitamin B-12 173 (L) 180 - 914 pg/mL    Comment: (NOTE) This assay is not validated for testing neonatal or myeloproliferative syndrome specimens for Vitamin B12 levels. Performed at [facility]   Folate     Status: None   Collection Time: 11/24/15  6:02 PM  Result Value Ref Range   Folate 7.2 >5.9 ng/mL    Comment: Performed at [facility]  Iron and TIBC     Status: Abnormal   Collection Time: 11/24/15  6:02 PM  Result Value Ref Range   Iron 12 (L) 28 - 170 ug/dL   TIBC 412 250 - 450 ug/dL   Saturation Ratios 3 (L) 10.4 - 31.8 %   UIBC 400 ug/dL    Comment: Performed at [facility]  Ferritin     Status: Abnormal   Collection Time: 11/24/15  6:02 PM  Result Value Ref Range   Ferritin 4 (L) 11 -  307 ng/mL    Comment: Performed at [facility]  Reticulocytes     Status: Abnormal   Collection Time: 11/24/15  6:02 PM  Result Value Ref Range   Retic Ct Pct 1.5 0.4 - 3.1 %   RBC. 3.82 (L) 3.87 - 5.11 MIL/uL   Retic Count, Manual 57.3 19.0 - 186.0 K/uL  Troponin I     Status: None   Collection Time: 11/24/15  6:02 PM  Result Value Ref Range   Troponin I <0.03 <0.031 ng/mL    Comment:        NO INDICATION OF MYOCARDIAL INJURY.   ABO/Rh     Status: None   Collection Time: 11/24/15  6:02 PM  Result Value Ref Range   ABO/RH(D) A POS   POC occult blood, ED RN will collect     Status: Abnormal   Collection Time: 11/24/15  6:26 PM  Result Value Ref Range   Fecal Occult Bld POSITIVE (A) NEGATIVE  Prepare RBC     Status: None   Collection Time: 11/24/15  7:00 PM  Result Value Ref Range   Order Confirmation ORDER PROCESSED BY BLOOD BANK   Urinalysis, Routine w reflex microscopic (not at [facility])     Status: Abnormal   Collection Time: 11/24/15  7:52 PM  Result Value Ref Range   Color, Urine YELLOW YELLOW   APPearance CLOUDY (A) CLEAR   Specific Gravity, Urine 1.007 1.005 - 1.030   pH 7.0 5.0 - 8.0   Glucose, UA NEGATIVE  NEGATIVE mg/dL   Hgb urine dipstick NEGATIVE NEGATIVE   Bilirubin Urine NEGATIVE NEGATIVE   Ketones, ur NEGATIVE NEGATIVE mg/dL   Protein, ur NEGATIVE NEGATIVE mg/dL   Nitrite NEGATIVE NEGATIVE   Leukocytes, UA LARGE (A) NEGATIVE  Urine microscopic-add on     Status: Abnormal   Collection Time: 11/24/15  7:52 PM  Result Value Ref Range   Squamous Epithelial / LPF 6-30 (A) NONE SEEN   WBC, UA 6-30 0 - 5 WBC/hpf   RBC / HPF 0-5 0 - 5 RBC/hpf   Bacteria, UA MANY (A) NONE SEEN   Urine-Other YEAST PRESENT   Hemoglobin     Status: Abnormal   Collection Time: 11/24/15  7:54 PM  Result Value Ref Range   Hemoglobin 6.3 (LL) 12.0 - 15.0 g/dL    Comment: REPEATED TO VERIFY CRITICAL VALUE NOTED.  VALUE IS CONSISTENT WITH PREVIOUSLY REPORTED AND CALLED VALUE.    Comprehensive metabolic panel     Status: Abnormal   Collection Time: 11/25/15  3:23 AM  Result Value Ref Range   Sodium 136 135 - 145 mmol/L   Potassium 2.6 (LL) 3.5 - 5.1 mmol/L    Comment: DELTA CHECK NOTED REPEATED TO VERIFY CRITICAL RESULT CALLED TO, READ BACK BY AND VERIFIED WITH: [facility], RN 0424 11/25/15  BY A NORMAN    Chloride 105 101 - 111 mmol/L   CO2 23 22 - 32 mmol/L   Glucose, Bld 111 (H) 65 - 99 mg/dL   BUN 8 6 - 20 mg/dL   Creatinine, Ser 0.69 0.44 - 1.00 mg/dL   Calcium 8.3 (L) 8.9 - 10.3 mg/dL   Total Protein 6.1 (L) 6.5 - 8.1 g/dL   Albumin 3.4 (L) 3.5 - 5.0 g/dL   AST 14 (L) 15 - 41 U/L   ALT 11 (L) 14 - 54 U/L   Alkaline Phosphatase 90 38 - 126 U/L   Total Bilirubin 0.7 0.3 - 1.2 mg/dL   GFR calc non Af Amer >60 >60 mL/min   GFR calc Af Amer >60 >60 mL/min    Comment: (NOTE) The eGFR has been calculated using the CKD EPI equation. This calculation has not been validated in all clinical situations. eGFR's persistently <60 mL/min signify possible Chronic Kidney Disease.    Anion gap 8 5 - 15  CBC     Status: Abnormal   Collection Time: 11/25/15  3:23 AM  Result Value Ref Range   WBC 9.1 4.0 - 10.5 K/uL   RBC 3.63 (L) 3.87 - 5.11 MIL/uL   Hemoglobin 7.3 (L) 12.0 - 15.0 g/dL   HCT 24.3 (L) 36.0 - 46.0 %   MCV 66.9 (L) 78.0 - 100.0 fL    Comment: REPEATED TO VERIFY DELTA CHECK NOTED POST TRANSFUSION SPECIMEN    MCH 20.1 (L) 26.0 - 34.0 pg   MCHC 30.0 30.0 - 36.0 g/dL   RDW 22.3 (H) 11.5 - 15.5 %   Platelets 367 150 - 400 K/uL  Magnesium     Status: None   Collection Time: 11/25/15  3:23 AM  Result Value Ref Range   Magnesium 1.8 1.7 - 2.4 mg/dL  CBC     Status: Abnormal   Collection Time: 11/25/15  2:38 PM  Result Value Ref Range   WBC 9.8 4.0 - 10.5 K/uL   RBC 4.39 3.87 - 5.11 MIL/uL   Hemoglobin 9.1 (L) 12.0 - 15.0 g/dL   HCT 30.4 (L) 36.0 - 46.0 %   MCV 69.2 (L)  78.0 - 100.0 fL   MCH 20.7 (L) 26.0 - 34.0 pg   MCHC 29.9 (L) 30.0 - 36.0  g/dL   RDW 22.2 (H) 11.5 - 15.5 %   Platelets 387 150 - 400 K/uL  Basic metabolic panel Once     Status: Abnormal   Collection Time: 11/25/15  2:38 PM  Result Value Ref Range   Sodium 141 135 - 145 mmol/L   Potassium 3.1 (L) 3.5 - 5.1 mmol/L   Chloride 108 101 - 111 mmol/L   CO2 23 22 - 32 mmol/L   Glucose, Bld 94 65 - 99 mg/dL   BUN <5 (L) 6 - 20 mg/dL   Creatinine, Ser 0.69 0.44 - 1.00 mg/dL   Calcium 8.9 8.9 - 10.3 mg/dL   GFR calc non Af Amer >60 >60 mL/min   GFR calc Af Amer >60 >60 mL/min    Comment: (NOTE) The eGFR has been calculated using the CKD EPI equation. This calculation has not been validated in all clinical situations. eGFR's persistently <60 mL/min signify possible Chronic Kidney Disease.    Anion gap 10 5 - 15  CBC     Status: Abnormal   Collection Time: 11/26/15  3:39 AM  Result Value Ref Range   WBC 9.3 4.0 - 10.5 K/uL   RBC 4.14 3.87 - 5.11 MIL/uL   Hemoglobin 8.4 (L) 12.0 - 15.0 g/dL   HCT 28.7 (L) 36.0 - 46.0 %   MCV 69.3 (L) 78.0 - 100.0 fL   MCH 20.3 (L) 26.0 - 34.0 pg   MCHC 29.3 (L) 30.0 - 36.0 g/dL   RDW 21.9 (H) 11.5 - 15.5 %   Platelets 380 150 - 400 K/uL  Basic metabolic panel     Status: Abnormal   Collection Time: 11/26/15  3:39 AM  Result Value Ref Range   Sodium 140 135 - 145 mmol/L   Potassium 3.5 3.5 - 5.1 mmol/L   Chloride 112 (H) 101 - 111 mmol/L   CO2 23 22 - 32 mmol/L   Glucose, Bld 111 (H) 65 - 99 mg/dL   BUN 6 6 - 20 mg/dL   Creatinine, Ser 0.60 0.44 - 1.00 mg/dL   Calcium 8.1 (L) 8.9 - 10.3 mg/dL   GFR calc non Af Amer >60 >60 mL/min   GFR calc Af Amer >60 >60 mL/min    Comment: (NOTE) The eGFR has been calculated using the CKD EPI equation. This calculation has not been validated in all clinical situations. eGFR's persistently <60 mL/min signify possible Chronic Kidney Disease.    Anion gap 5 5 - 15    Current Facility-Administered Medications  Medication Dose Route Frequency Provider Last Rate Last Dose  . 0.9 %   sodium chloride infusion   Intravenous Continuous Donne Hazel, MD 100 mL/hr at 11/25/15 1651    . baclofen (LIORESAL) tablet 10 mg  10 mg Oral Daily Donne Hazel, MD   10 mg at 11/25/15 1006  . baclofen (LIORESAL) tablet 20 mg  20 mg Oral QHS Donne Hazel, MD   20 mg at 11/25/15 2105  . buPROPion (WELLBUTRIN SR) 12 hr tablet 150 mg  150 mg Oral Daily Donne Hazel, MD   150 mg at 11/25/15 1006  . clonazePAM (KLONOPIN) tablet 1 mg  1 mg Oral BID Donne Hazel, MD   1 mg at 11/25/15 2105  . estradiol (ESTRACE) tablet 0.5 mg  0.5 mg Oral Daily Donne Hazel, MD  0.5 mg at 11/25/15 1007  . lubiprostone (AMITIZA) capsule 24 mcg  24 mcg Oral Q breakfast Donne Hazel, MD   24 mcg at 11/26/15 [phone]  . methocarbamol (ROBAXIN) tablet 500 mg  500 mg Oral QHS Donne Hazel, MD   500 mg at 11/25/15 2105  . metoCLOPramide (REGLAN) tablet 10 mg  10 mg Oral QID Donne Hazel, MD   10 mg at 11/25/15 2105  . pantoprazole (PROTONIX) injection 40 mg  40 mg Intravenous Q12H Donne Hazel, MD   40 mg at 11/25/15 2105  . prazosin (MINIPRESS) capsule 5 mg  5 mg Oral QHS Donne Hazel, MD   5 mg at 11/25/15 2105  . topiramate (TOPAMAX) tablet 100 mg  100 mg Oral QHS Donne Hazel, MD   100 mg at 11/25/15 2105    Musculoskeletal: Strength & Muscle Tone: decreased Gait & Station: unable to stand Patient leans: N/A  Psychiatric Specialty Exam: ROS stomach upset, anemia, generalized weakness, depression, anxiety, and a significant stress and repeated syncopal episodes. Patient lost significant weight the last 8 months.   Blood pressure 106/55, pulse 82, temperature 98.3 F (36.8 C), temperature source Oral, resp. rate 18, height _0  (1.676 m), weight 93 kg (205 lb 0.4 oz), SpO2 97 %.Body mass index is 33.11 kg/(m^2).  General Appearance: Casual  Eye Contact::  Good  Speech:  Clear and Coherent  Volume:  Decreased  Mood:  Anxious and Depressed  Affect:  Constricted and Depressed  Thought Process:   Coherent and Goal Directed  Orientation:  Full (Time, Place, and Person)  Thought Content:  Delusions and Obsessions  Suicidal Thoughts:  No  Homicidal Thoughts:  No  Memory:  Immediate;   Good Recent;   Good  Judgement:  Intact  Insight:  Good  Psychomotor Activity:  Decreased  Concentration:  Good  Recall:  Good  Fund of Knowledge:Good  Language: Good  Akathisia:  Negative  Handed:  Right  AIMS (if indicated):     Assets:  Communication Skills Desire for Improvement Financial Resources/Insurance Housing Leisure Time [place] Talents/Skills Transportation Vocational/Educational  ADL's:  Intact  Cognition: WNL  Sleep:      Treatment Plan Summary: Daily contact with patient to assess and evaluate symptoms and progress in treatment and Medication management  Patient has no safety concerns and denied active suicidal/homicidal ideation, intention or plans.  Decrease Topamax 50 mg at bedtime and also Minipress 2 mg at bedtime to reduce dizziness and syncopal episodes.   Continue Wellbutrin SR 150 mg daily which can be increased to 300 mg if needed for depression and clonazepam 1 mg 2 times daily for anxiety.  Appreciate psychiatric consultation and follow up as clinically required Please contact [phone] or [phone] if needs further assistance .  Disposition: Patient will be referred to the outpatient psychiatric services at neuropsychiatry when medically stable.  Patient does not meet criteria for psychiatric inpatient admission. Supportive therapy provided about ongoing stressors.  Durward Parcel., MD 11/26/2015 10:09 AM

## 2015-11-26 NOTE — Evaluation (Signed)
Physical Therapy Evaluation-1x Patient Details Name: Tracey Estrada MRN: [id number] DOB: 06/08/1966 Today's Date: 11/26/2015   History of Present Illness  50 yo female admitted with syncope, anemia. Hx of panic d/o, MS, COPD, fibromyalgia, MDD.   Clinical Impression  On eval, pt was supervision level for mobility (for safety only due to previous syncopal episode)-walked ~215 feet while pushing IV pole. Pt c/o mild lightheadedness but denied dizziness. She also stated she feels a little weaker than baseline. Pt reports she has been walking to/from bathroom without assistance. Do not feel pt has any further acute PT needs. Will sign off.     Follow Up Recommendations No PT follow up    Equipment Recommendations  None recommended by PT    Recommendations for Other Services       Precautions / Restrictions Precautions Precautions: None Restrictions Weight Bearing Restrictions: No      Mobility  Bed Mobility Overal bed mobility: Modified Independent                Transfers Overall transfer level: Modified independent                  Ambulation/Gait Ambulation/Gait assistance: Supervision Ambulation Distance (Feet): 215 Feet   Gait Pattern/deviations: Step-through pattern     General Gait Details: pt tolerated distance well. good gait speed. Able to manage with IV pole well in environment. c/o mild lightheadedness but no dizziness.   Stairs            Wheelchair Mobility    Modified Rankin (Stroke Patients Only)       Balance                                             Pertinent Vitals/Pain Pain Assessment: No/denies pain    Home Living Family/patient expects to be discharged to:: Private residence Living Arrangements: Children   Type of Home: House Home Access: Level entry     Home Layout: One level Home Equipment: None      Prior Function Level of Independence: Independent               Hand Dominance         Extremity/Trunk Assessment   Upper Extremity Assessment: Overall WFL for tasks assessed           Lower Extremity Assessment: Overall WFL for tasks assessed      Cervical / Trunk Assessment: Normal  Communication   Communication: No difficulties  Cognition Arousal/Alertness: Awake/alert Behavior During Therapy: WFL for tasks assessed/performed Overall Cognitive Status: Within Functional Limits for tasks assessed                      General Comments      Exercises        Assessment/Plan    PT Assessment Patent does not need any further PT services  PT Diagnosis     PT Problem List    PT Treatment Interventions     PT Goals (Current goals can be found in the Care Plan section) Acute Rehab PT Goals PT Goal Formulation: All assessment and education complete, DC therapy    Frequency     Barriers to discharge        Co-evaluation               End of Session  Activity Tolerance: Patient tolerated treatment well Patient left: in bed;with call bell/phone within reach      Functional Assessment Tool Used: clinical judgement Functional Limitation: Mobility: Walking and moving around Mobility: Walking and Moving Around Current Status ([id number]): At least 1 percent but less than 20 percent impaired, limited or restricted Mobility: Walking and Moving Around Goal Status [phone]): At least 1 percent but less than 20 percent impaired, limited or restricted Mobility: Walking and Moving Around Discharge Status [phone]): At least 1 percent but less than 20 percent impaired, limited or restricted    Time: 0912-0923 PT Time Calculation (min) (ACUTE ONLY): 11 min   Charges:   PT Evaluation $PT Eval Low Complexity: 1 Procedure     PT G Codes:   PT G-Codes **NOT FOR INPATIENT CLASS** Functional Assessment Tool Used: clinical judgement Functional Limitation: Mobility: Walking and moving around Mobility: Walking and Moving Around Current Status  (B1478): At least 1 percent but less than 20 percent impaired, limited or restricted Mobility: Walking and Moving Around Goal Status [phone]): At least 1 percent but less than 20 percent impaired, limited or restricted Mobility: Walking and Moving Around Discharge Status [phone]): At least 1 percent but less than 20 percent impaired, limited or restricted    Rebeca Alert, MPT Pager: [phone]

## 2015-11-26 NOTE — Discharge Summary (Signed)
Physician Discharge Summary  Tracey Estrada ZOX:[id number] DOB: 1966/05/01 DOA: 11/24/2015  PCP: Lonia Blood, MD  Admit date: 11/24/2015 Discharge date: 11/26/2015  Recommendations for Outpatient Follow-up:  1. Anemia, iron deficiency. The patient already has an appointment to see gastroenterology as an outpatient for colonoscopy. 2. Suggest repeat CBC in 1 week on follow-up 3. Continent device of psychiatry and given recent syncope, Topamax and Minipress and then decreased. She has been referred for outpatient neuropsychiatry   Follow-up Information    Follow up with [facility], [facility]. Schedule an appointment as soon as possible for a visit in 1 week.   Specialty:  Internal Medicine   Contact information:   [address] [place] [address] [phone]       Follow up with Yancey Flemings, MD.   Specialty:  Gastroenterology   Contact information:   [address] [place] [address] [phone]      Discharge Diagnoses:  1. Syncope secondary symptomatically blood loss 2. GI bleed 3. Acute blood loss anemia, iron deficiency anemia, subacute blood loss anemia 4. Hypokalemia  Discharge Condition: improved Disposition: home  Diet recommendation: regular  Filed Weights   11/24/15 2104  Weight: 93 kg (205 lb 0.4 oz)    History of present illness:  50 year old woman presented to urgent care with complaints of syncope, lightheadedness, generalized weakness; found to have hemoglobin 6.7 and was referred to the emergency department.  Hospital Course:  Patient was transfused with packed red blood cells with improvement in hemoglobin. She had no bleeding. Anemia found to be iron deficiency and subacute blood loss suspected. Seen by GI and underwent uneventful and unremarkable EGD. Plans were made for close outpatient follow-up with gastroenterology. Also noted to have complaints of scalp lesions and was seen by psychiatry for the possibility of delusional parasitosis.  Psychiatry cleared for outpatient follow-up. Individual issues as below.   Syncope secondary to symptomatic blood loss. No recurrence. Asymptomatic now.  GI bleed, with microcytic anemia suggestive of subacute blood loss. Context of daily NSAID use for fibromyalgia. Past medical history hysterectomy. No active bleeding noted. EGD was unremarkable. Patient has been scheduled for outpatient colonoscopy.  Acute blood loss anemia, iron deficient, symptomatic. Status post 2 units red blood cells.   Hypokalemia. Likely secondary to hydrochlorothiazide, now discontinued. Magnesium normal.  Delusional parasitosis? This was questioned by urgent care. No evidence of infection at this point. Scalp lesion etiology unclear. Follow clinically. Psychiatry has recommended outpatient follow-up.  PMH panic disorder, depression, fibromyalgia, PTSD, multiple sclerosis, COPD   She has outpatient follow-up with gastroenterology arranged. Recommend iron. Follow-up with PCP in one week for repeat CBC. Return for bleeding or worsening of condition.  Consultants:  GI  Psychiatry   Procedures:  Transfusion packed red blood cells 2 units   EGD ENDOSCOPIC IMPRESSION: 1. GERD normal EGD status post duodenal biopsies  Discharge Instructions  Discharge Instructions    Diet general    Complete by:  As directed      Discharge instructions    Complete by:  As directed   Call your physician or seek immediate medical attention for bleeding, weakness, dizziness, falls, passing out or worsening of condition.     Increase activity slowly    Complete by:  As directed           Current Discharge Medication List    START taking these medications   Details  ferrous sulfate 325 (65 FE) MG EC tablet Take 1 tablet (325 mg total) by mouth 2 (  two) times daily with a meal.      CONTINUE these medications which have CHANGED   Details  prazosin (MINIPRESS) 2 MG capsule Take 1 capsule (2 mg total) by mouth at  bedtime. Qty: 30 capsule, Refills: 0    topiramate (TOPAMAX) 50 MG tablet Take 1 tablet (50 mg total) by mouth at bedtime.      CONTINUE these medications which have NOT CHANGED   Details  baclofen (LIORESAL) 10 MG tablet Take 10-20 mg by mouth 2 (two) times daily. 10 mg in am and 20 mg at bed time    buPROPion (WELLBUTRIN SR) 150 MG 12 hr tablet Take 150 mg by mouth daily.    clonazePAM (KLONOPIN) 1 MG tablet Take 1 mg by mouth 2 (two) times daily. Reported on 11/24/2015    estradiol (ESTRACE) 0.5 MG tablet Take 0.5 mg by mouth daily.    lubiprostone (AMITIZA) 24 MCG capsule Take 24 mcg by mouth daily with breakfast.    methocarbamol (ROBAXIN) 500 MG tablet Take 500 mg by mouth at bedtime.    metoCLOPramide (REGLAN) 10 MG tablet Take 10 mg by mouth 4 (four) times daily.    pantoprazole (PROTONIX) 40 MG tablet Take 40 mg by mouth 2 (two) times daily. Reported on 11/24/2015    Venlafaxine HCl (EFFEXOR PO) Take 225 tablets by mouth daily.    meclizine (ANTIVERT) 50 MG tablet Take 1 tablet (50 mg total) by mouth 3 (three) times daily as needed. Qty: 30 tablet, Refills: 0      STOP taking these medications     hydrochlorothiazide (HYDRODIURIL) 25 MG tablet      naproxen sodium (ANAPROX) 220 MG tablet      ondansetron (ZOFRAN) 8 MG tablet        Allergies  Allergen Reactions  . Other     Anything with steroid causes psychosis    The results of significant diagnostics from this hospitalization (including imaging, microbiology, ancillary and laboratory) are listed below for reference.      Labs: Basic Metabolic Panel:  Recent Labs Lab 11/24/15 1802 11/25/15 0323 11/25/15 1438 11/26/15 0339  NA 135 136 141 140  K 2.4* 2.6* 3.1* 3.5  CL 101 105 108 112*  CO2 22 23 23 23   GLUCOSE 94 111* 94 111*  BUN 10 8 <5* 6  CREATININE 0.73 0.69 0.69 0.60  CALCIUM 8.9 8.3* 8.9 8.1*  MG  --  1.8  --   --    Liver Function Tests:  Recent Labs Lab 11/24/15 1802  11/25/15 0323  AST 16 14*  ALT 11* 11*  ALKPHOS 103 90  BILITOT 0.6 0.7  PROT 7.2 6.1*  ALBUMIN 4.2 3.4*   CBC:  Recent Labs Lab 11/24/15 1559 11/24/15 1802 11/24/15 1954 11/25/15 0323 11/25/15 1438 11/26/15 0339  WBC 12.2* 10.7*  --  9.1 9.8 9.3  NEUTROABS  --  8.1*  --   --   --   --   HGB 6.7* 6.6* 6.3* 7.3* 9.1* 8.4*  HCT 21.5* 23.8*  --  24.3* 30.4* 28.7*  MCV 58.2* 62.3*  --  66.9* 69.2* 69.3*  PLT  --  456*  --  367 387 380   Cardiac Enzymes:  Recent Labs Lab 11/24/15 1802  TROPONINI <0.03    Principal Problem:   MDD (major depressive disorder), recurrent, severe, with psychosis (HCC) Active Problems:   Depression   Multiple sclerosis (HCC)   Anemia   Acute blood loss anemia   Symptomatic  anemia   Anemia, iron deficiency   Heme positive stool   Time coordinating discharge: 20 minutes  Signed:  Brendia Sacks, MD [facility] 11/26/2015, 5:35 PM

## 2015-11-30 ENCOUNTER — Encounter (HOSPITAL_COMMUNITY): Payer: Self-pay | Admitting: Internal Medicine

## 2015-12-02 ENCOUNTER — Encounter: Payer: Self-pay | Admitting: Internal Medicine

## 2015-12-04 ENCOUNTER — Encounter: Payer: Self-pay | Admitting: Internal Medicine

## 2015-12-24 ENCOUNTER — Encounter: Payer: 59 | Admitting: Internal Medicine

## 2016-01-13 ENCOUNTER — Encounter: Payer: Self-pay | Admitting: Internal Medicine

## 2016-01-25 ENCOUNTER — Encounter: Payer: Self-pay | Admitting: Internal Medicine

## 2016-02-23 ENCOUNTER — Emergency Department (HOSPITAL_COMMUNITY)
Admission: EM | Admit: 2016-02-23 | Discharge: 2016-02-23 | Disposition: A | Discharge status: Home / Self Care | Payer: Medicaid Other | Attending: Emergency Medicine | Admitting: Emergency Medicine

## 2016-02-23 ENCOUNTER — Encounter (HOSPITAL_COMMUNITY): Payer: Self-pay

## 2016-02-23 DIAGNOSIS — I10 Essential (primary) hypertension: Secondary | ICD-10-CM | POA: Insufficient documentation

## 2016-02-23 DIAGNOSIS — Z87891 Personal history of nicotine dependence: Secondary | ICD-10-CM | POA: Diagnosis not present

## 2016-02-23 DIAGNOSIS — K625 Hemorrhage of anus and rectum: Secondary | ICD-10-CM

## 2016-02-23 DIAGNOSIS — J449 Chronic obstructive pulmonary disease, unspecified: Secondary | ICD-10-CM | POA: Insufficient documentation

## 2016-02-23 LAB — COMPREHENSIVE METABOLIC PANEL
ALBUMIN: 4.3 g/dL (ref 3.5–5.0)
ALT: 11 U/L — AB (ref 14–54)
AST: 12 U/L — AB (ref 15–41)
Alkaline Phosphatase: 102 U/L (ref 38–126)
Anion gap: 8 (ref 5–15)
BUN: 6 mg/dL (ref 6–20)
CHLORIDE: 109 mmol/L (ref 101–111)
CO2: 22 mmol/L (ref 22–32)
CREATININE: 0.77 mg/dL (ref 0.44–1.00)
Calcium: 9.4 mg/dL (ref 8.9–10.3)
GFR calc Af Amer: >60 mL/min (ref >60)
GLUCOSE: 98 mg/dL (ref 65–99)
POTASSIUM: 3.4 mmol/L — AB (ref 3.5–5.1)
SODIUM: 139 mmol/L (ref 135–145)
Total Bilirubin: 0.4 mg/dL (ref 0.3–1.2)
Total Protein: 7.7 g/dL (ref 6.5–8.1)

## 2016-02-23 LAB — CBC
HEMATOCRIT: 42 % (ref 36.0–46.0)
HEMATOCRIT: 38 % (ref 36.0–46.0)
HEMOGLOBIN: 12.3 g/dL (ref 12.0–15.0)
Hemoglobin: 13.6 g/dL (ref 12.0–15.0)
MCH: 26.5 pg (ref 26.0–34.0)
MCH: 26.4 pg (ref 26.0–34.0)
MCHC: 32.4 g/dL (ref 30.0–36.0)
MCHC: 32.4 g/dL (ref 30.0–36.0)
MCV: 81.9 fL (ref 78.0–100.0)
MCV: 81.5 fL (ref 78.0–100.0)
PLATELETS: 427 10*3/uL — AB (ref 150–400)
PLATELETS: 376 10*3/uL (ref 150–400)
RBC: 5.13 MIL/uL — ABNORMAL HIGH (ref 3.87–5.11)
RBC: 4.66 MIL/uL (ref 3.87–5.11)
RDW: 17.2 % — AB (ref 11.5–15.5)
RDW: 17.1 % — AB (ref 11.5–15.5)
WBC: 5.9 10*3/uL (ref 4.0–10.5)
WBC: 7.4 10*3/uL (ref 4.0–10.5)

## 2016-02-23 LAB — TYPE AND SCREEN
ABO/RH(D): A POS
Antibody Screen: NEGATIVE

## 2016-02-23 LAB — POC OCCULT BLOOD, ED: Fecal Occult Bld: POSITIVE — AB

## 2016-02-23 NOTE — ED Provider Notes (Signed)
CSN: [id number]     Arrival date & time 02/23/16  1601 History   First MD Initiated Contact with Patient 02/23/16 1821     Chief Complaint  Patient presents with  . Rectal Bleeding     (Consider location/radiation/quality/duration/timing/severity/associated sxs/prior Treatment) HPI Patient reports that she was having a rectal bleeding that started yesterday with bowel movement. She has had bright red bleeding and noted some dripping and also a blood in her underwear. Patient does have prior history of GI bleed with anemia. No vomiting. She reports some abdominal cramping and discomfort. The patient reports feeling slightly weaker with activity and position change. Patient has had a history of surgical hemorrhoid repair and fissure repair in the early 2000. She did have a hospitalization 3 months ago for GI bleeding and had upper endoscopy that was normal but has not yet had her lower endoscopy. She reports they were not able to schedule until July. Past Medical History  Diagnosis Date  . Panic disorder   . MS (multiple sclerosis) (HCC)   . Hypertension   . COPD (chronic obstructive pulmonary disease) (HCC)   . Major depressive disorder (HCC)   . Nonalcoholic fatty liver disease   . GERD (gastroesophageal reflux disease)   . Fibromyalgia   . PTSD (post-traumatic stress disorder)   . Allergy    Past Surgical History  Procedure Laterality Date  . Tonsillectomy    . Cesarean section    . Hemorrhoid surgery    . Wrist surgery    . Tubal ligation    . Abdominal hysterectomy    . Esophagogastroduodenoscopy N/A 11/25/2015    Procedure: ESOPHAGOGASTRODUODENOSCOPY (EGD);  Surgeon: Hilarie Fredrickson, MD;  Location: [facility];  Service: Endoscopy;  Laterality: N/A;   Family History  Problem Relation Age of Onset  . Hypertension Mother   . Heart failure Mother   . Hypertension Sister   . Stroke Sister    Social History  Substance Use Topics  . Smoking status: Former Smoker    Quit date:  11/24/2012  . Smokeless tobacco: Never Used  . Alcohol Use: No   OB History    No data available     Review of Systems 10 Systems reviewed and are negative for acute change except as noted in the HPI.    Allergies  Other  Home Medications   Prior to Admission medications   Medication Sig Start Date End Date Taking? Authorizing Provider  baclofen (LIORESAL) 10 MG tablet Take 10-20 mg by mouth 2 (two) times daily. 10 mg in am and 20 mg at bed time   Yes Historical Provider, MD  buPROPion (WELLBUTRIN XL) 300 MG 24 hr tablet Take 300 mg by mouth daily.   Yes Historical Provider, MD  clonazePAM (KLONOPIN) 1 MG tablet Take 1 mg by mouth 3 (three) times daily.   Yes Historical Provider, MD  estradiol (ESTRACE) 0.5 MG tablet Take 0.5 mg by mouth daily.   Yes Historical Provider, MD  ferrous sulfate 325 (65 FE) MG EC tablet Take 1 tablet (325 mg total) by mouth 2 (two) times daily with a meal. 11/26/15  Yes Standley Brooking, MD  lubiprostone (AMITIZA) 24 MCG capsule Take 24 mcg by mouth daily with breakfast.   Yes Historical Provider, MD  methocarbamol (ROBAXIN) 750 MG tablet Take 750 mg by mouth 2 (two) times daily.   Yes Historical Provider, MD  metoCLOPramide (REGLAN) 10 MG tablet Take 10 mg by mouth 4 (four) times daily.  Yes Historical Provider, MD  naltrexone (DEPADE) 50 MG tablet Take 25 mg by mouth at bedtime.   Yes Historical Provider, MD  pantoprazole (PROTONIX) 40 MG tablet Take 40 mg by mouth 2 (two) times daily. Reported on 11/24/2015   Yes Historical Provider, MD  prazosin (MINIPRESS) 2 MG capsule Take 1 capsule (2 mg total) by mouth at bedtime. 11/26/15  Yes Standley Brooking, MD  topiramate (TOPAMAX) 50 MG tablet Take 1 tablet (50 mg total) by mouth at bedtime. 11/26/15  Yes Standley Brooking, MD  Venlafaxine HCl (EFFEXOR XR PO) Take 225 mg by mouth daily.   Yes Historical Provider, MD  meclizine (ANTIVERT) 50 MG tablet Take 1 tablet (50 mg total) by mouth 3 (three) times daily as  needed. Patient not taking: Reported on 11/24/2015 09/03/12   Jerelyn Scott, MD   BP 148/83 mmHg  Pulse 80  Temp(Src) 98.4 F (36.9 C) (Oral)  Resp 19  SpO2 100% Physical Exam  Constitutional: She is oriented to person, place, and time. She appears well-developed and well-nourished.  HENT:  Head: Normocephalic and atraumatic.  Eyes: EOM are normal. Pupils are equal, round, and reactive to light.  Neck: Neck supple.  Cardiovascular: Normal rate, regular rhythm, normal heart sounds and intact distal pulses.   Pulmonary/Chest: Effort normal and breath sounds normal.  Abdominal: Soft. Bowel sounds are normal. She exhibits no distension. There is no tenderness.  Genitourinary:  Rectal exam does show a small thrombosed hemorrhoid. No visible blood in the rectal vault. Digital examination shows brown, green stool. No melena. No bright red blood.  Musculoskeletal: Normal range of motion. She exhibits no edema or tenderness.  Neurological: She is alert and oriented to person, place, and time. She has normal strength. No cranial nerve deficit. She exhibits normal muscle tone. Coordination normal. GCS eye subscore is 4. GCS verbal subscore is 5. GCS motor subscore is 6.  Skin: Skin is warm, dry and intact.  Psychiatric: She has a normal mood and affect.    ED Course  Procedures (including critical care time) Labs Review Labs Reviewed  COMPREHENSIVE METABOLIC PANEL - Abnormal; Notable for the following:    Potassium 3.4 (*)    AST 12 (*)    ALT 11 (*)    All other components within normal limits  CBC - Abnormal; Notable for the following:    RBC 5.13 (*)    RDW 17.2 (*)    Platelets 427 (*)    All other components within normal limits  CBC - Abnormal; Notable for the following:    RDW 17.1 (*)    All other components within normal limits  POC OCCULT BLOOD, ED - Abnormal; Notable for the following:    Fecal Occult Bld POSITIVE (*)    All other components within normal limits  POC  OCCULT BLOOD, ED  TYPE AND SCREEN    Imaging Review No results found. I have personally reviewed and evaluated these images and lab results as part of my medical decision-making.   EKG Interpretation None      MDM   Final diagnoses:  Rectal bleeding   Patient has a prior history of GI bleed with anemia. Today she has been noticing bright red rectal bleeding. H&H are stable and vital signs are stable. On examination, the patient does have evident hemorrhoids and non-melanotic stool. Currently I suspect rectal bleeding from hemorrhoids. I feel patient is safe to return home with close follow-up instructions and return precautions. These have been  reviewed.    Arby Barrette, MD 02/23/16 [phone]

## 2016-02-23 NOTE — Discharge Instructions (Signed)
°Gastrointestinal Bleeding °Gastrointestinal (GI) bleeding means there is bleeding somewhere along the digestive tract, between the mouth and anus. °CAUSES  °There are many different problems that can cause GI bleeding. Possible causes include: °· Esophagitis. This is inflammation, irritation, or swelling of the esophagus. °· Hemorrhoids. These are veins that are full of blood (engorged) in the rectum. They cause pain, inflammation, and may bleed. °· Anal fissures. These are areas of painful tearing which may bleed. They are often caused by passing hard stool. °· Diverticulosis. These are pouches that form on the colon over time, with age, and may bleed significantly. °· Diverticulitis. This is inflammation in areas with diverticulosis. It can cause pain, fever, and bloody stools, although bleeding is rare. °· Polyps and cancer. Colon cancer often starts out as precancerous polyps. °· Gastritis and ulcers. Bleeding from the upper gastrointestinal tract (near the stomach) may travel through the intestines and produce black, sometimes tarry, often bad smelling stools. In certain cases, if the bleeding is fast enough, the stools may not be black, but red. This condition may be life-threatening. °SYMPTOMS  °· Vomiting bright red blood or material that looks like coffee grounds. °· Bloody, black, or tarry stools. °DIAGNOSIS  °Your caregiver may diagnose your condition by taking your history and performing a physical exam. More tests may be needed, including: °· X-rays and other imaging tests. °· Esophagogastroduodenoscopy (EGD). This test uses a flexible, lighted tube to look at your esophagus, stomach, and small intestine. °· Colonoscopy. This test uses a flexible, lighted tube to look at your colon. °TREATMENT  °Treatment depends on the cause of your bleeding.  °· For bleeding from the esophagus, stomach, small intestine, or colon, the caregiver doing your EGD or colonoscopy may be able to stop the bleeding as part of  the procedure. °· Inflammation or infection of the colon can be treated with medicines. °· Many rectal problems can be treated with creams, suppositories, or warm baths. °· Surgery is sometimes needed. °· Blood transfusions are sometimes needed if you have lost a lot of blood. °If bleeding is slow, you may be allowed to go home. If there is a lot of bleeding, you will need to stay in the hospital for observation. °HOME CARE INSTRUCTIONS  °· Take any medicines exactly as prescribed. °· Keep your stools soft by eating foods that are high in fiber. These foods include whole grains, legumes, fruits, and vegetables. Prunes (1 to 3 a day) work well for many people. °· Drink enough fluids to keep your urine clear or pale yellow. °SEEK IMMEDIATE MEDICAL CARE IF:  °· Your bleeding increases. °· You feel lightheaded, weak, or you faint. °· You have severe cramps in your back or abdomen. °· You pass large blood clots in your stool. °· Your problems are getting worse. °MAKE SURE YOU:  °· Understand these instructions. °· Will watch your condition. °· Will get help right away if you are not doing well or get worse. °  °This information is not intended to replace advice given to you by your health care provider. Make sure you discuss any questions you have with your health care provider. °  °Document Released: 09/09/2000 Document Revised: 08/29/2012 Document Reviewed: 03/02/2015 °Elsevier Interactive Patient Education ©2016 [employer] °Hemorrhoids °Hemorrhoids are swollen veins around the rectum or anus. There are two types of hemorrhoids:  °· Internal hemorrhoids. These occur in the veins just inside the rectum. They may poke through to the outside and become irritated and painful. °· External hemorrhoids. These occur in   the veins outside the anus and can be felt as a painful swelling or hard lump near the anus. °CAUSES °· Pregnancy.   °· Obesity.   °· Constipation or diarrhea.   °· Straining to have a bowel movement.    °· Sitting for long periods on the toilet. °· Heavy lifting or other activity that caused you to strain. °· Anal intercourse. °SYMPTOMS  °· Pain.   °· Anal itching or irritation.   °· Rectal bleeding.   °· Fecal leakage.   °· Anal swelling.   °· One or more lumps around the anus.   °DIAGNOSIS  °Your caregiver may be able to diagnose hemorrhoids by visual examination. Other examinations or tests that may be performed include:  °· Examination of the rectal area with a gloved hand (digital rectal exam).   °· Examination of anal canal using a small tube (scope).   °· A blood test if you have lost a significant amount of blood. °· A test to look inside the colon (sigmoidoscopy or colonoscopy). °TREATMENT °Most hemorrhoids can be treated at home. However, if symptoms do not seem to be getting better or if you have a lot of rectal bleeding, your caregiver may perform a procedure to help make the hemorrhoids get smaller or remove them completely. Possible treatments include:  °· Placing a rubber band at the base of the hemorrhoid to cut off the circulation (rubber band ligation).   °· Injecting a chemical to shrink the hemorrhoid (sclerotherapy).   °· Using a tool to burn the hemorrhoid (infrared light therapy).   °· Surgically removing the hemorrhoid (hemorrhoidectomy).   °· Stapling the hemorrhoid to block blood flow to the tissue (hemorrhoid stapling).   °HOME CARE INSTRUCTIONS  °· Eat foods with fiber, such as whole grains, beans, nuts, fruits, and vegetables. Ask your doctor about taking products with added fiber in them (fiber supplements). °· Increase fluid intake. Drink enough water and fluids to keep your urine clear or pale yellow.   °· Exercise regularly.   °· Go to the bathroom when you have the urge to have a bowel movement. Do not wait.   °· Avoid straining to have bowel movements.   °· Keep the anal area dry and clean. Use wet toilet paper or moist towelettes after a bowel movement.   °· Medicated creams  and suppositories may be used or applied as directed.   °· Only take over-the-counter or prescription medicines as directed by your caregiver.   °· Take warm sitz baths for 15-20 minutes, 3-4 times a day to ease pain and discomfort.   °· Place ice packs on the hemorrhoids if they are tender and swollen. Using ice packs between sitz baths may be helpful.   °· Put ice in a plastic bag.   °· Place a towel between your skin and the bag.   °· Leave the ice on for 15-20 minutes, 3-4 times a day.   °· Do not use a donut-shaped pillow or sit on the toilet for long periods. This increases blood pooling and pain.   °SEEK MEDICAL CARE IF: °· You have increasing pain and swelling that is not controlled by treatment or medicine. °· You have uncontrolled bleeding. °· You have difficulty or you are unable to have a bowel movement. °· You have pain or inflammation outside the area of the hemorrhoids. °MAKE SURE YOU: °· Understand these instructions. °· Will watch your condition. °· Will get help right away if you are not doing well or get worse. °  °This information is not intended to replace advice given to you by your health care provider. Make sure you discuss any questions you have   with your health care provider. °  °Document Released: 09/09/2000 Document Revised: 08/29/2012 Document Reviewed: 07/17/2012 °Elsevier Interactive Patient Education ©2016 Elsevier Inc. ° °

## 2016-02-23 NOTE — ED Notes (Signed)
Pt has hx of gi bleed.  Pt now having abdominal pain with rectal bleeding since yesterday.  Hx of hbg 6.5.  Bright red.  Pt feeling weaker than normal.

## 2016-03-22 ENCOUNTER — Ambulatory Visit (AMBULATORY_SURGERY_CENTER): Payer: PRIVATE HEALTH INSURANCE | Admitting: *Deleted

## 2016-03-22 VITALS — Ht 66.0 in | Wt 197.0 lb

## 2016-03-22 DIAGNOSIS — R195 Other fecal abnormalities: Secondary | ICD-10-CM

## 2016-03-22 MED ORDER — NA SULFATE-K SULFATE-MG SULF 17.5-3.13-1.6 GM/177ML PO SOLN: 1.0000 | Freq: Once | ORAL | Status: DC

## 2016-03-22 NOTE — Progress Notes (Signed)
No egg or soy allergy known to patient  No issues with past sedation with any surgeries  or procedures, no intubation problems  No diet pills per patient No home 02 use per patient  No blood thinners per patient  Pt states  issues with constipation - either having diarrhea or constipation- on amitiza - no pattern- due to this did mag citrate and suprep per Dr Lamar Sprinkles prep choice   Steroids taken after hospitalization 2017 with no issues- given for rash- in 2005 had the issues

## 2016-03-26 ENCOUNTER — Encounter (HOSPITAL_COMMUNITY): Payer: Self-pay | Admitting: Emergency Medicine

## 2016-03-26 ENCOUNTER — Emergency Department (HOSPITAL_COMMUNITY)
Admission: EM | Admit: 2016-03-26 | Discharge: 2016-03-28 | Disposition: A | Discharge status: Home / Self Care | Payer: Medicaid Other | Attending: Emergency Medicine | Admitting: Emergency Medicine

## 2016-03-26 DIAGNOSIS — F332 Major depressive disorder, recurrent severe without psychotic features: Secondary | ICD-10-CM | POA: Diagnosis not present

## 2016-03-26 DIAGNOSIS — R45851 Suicidal ideations: Secondary | ICD-10-CM | POA: Diagnosis present

## 2016-03-26 DIAGNOSIS — Z79899 Other long term (current) drug therapy: Secondary | ICD-10-CM | POA: Insufficient documentation

## 2016-03-26 DIAGNOSIS — Z87891 Personal history of nicotine dependence: Secondary | ICD-10-CM | POA: Diagnosis not present

## 2016-03-26 DIAGNOSIS — J449 Chronic obstructive pulmonary disease, unspecified: Secondary | ICD-10-CM | POA: Insufficient documentation

## 2016-03-26 DIAGNOSIS — F411 Generalized anxiety disorder: Secondary | ICD-10-CM | POA: Diagnosis not present

## 2016-03-26 DIAGNOSIS — F4310 Post-traumatic stress disorder, unspecified: Secondary | ICD-10-CM | POA: Insufficient documentation

## 2016-03-26 DIAGNOSIS — I10 Essential (primary) hypertension: Secondary | ICD-10-CM | POA: Diagnosis not present

## 2016-03-26 LAB — COMPREHENSIVE METABOLIC PANEL
ALBUMIN: 4.1 g/dL (ref 3.5–5.0)
ALK PHOS: 84 U/L (ref 38–126)
ALT: 10 U/L — ABNORMAL LOW (ref 14–54)
ANION GAP: 6 (ref 5–15)
AST: 11 U/L — ABNORMAL LOW (ref 15–41)
BUN: 7 mg/dL (ref 6–20)
CALCIUM: 9.1 mg/dL (ref 8.9–10.3)
CHLORIDE: 113 mmol/L — AB (ref 101–111)
CO2: 21 mmol/L — AB (ref 22–32)
Creatinine, Ser: 0.64 mg/dL (ref 0.44–1.00)
GFR calc non Af Amer: >60 mL/min (ref >60)
GLUCOSE: 98 mg/dL (ref 65–99)
Potassium: 3.5 mmol/L (ref 3.5–5.1)
SODIUM: 140 mmol/L (ref 135–145)
Total Bilirubin: 0.6 mg/dL (ref 0.3–1.2)
Total Protein: 7.3 g/dL (ref 6.5–8.1)

## 2016-03-26 LAB — CBC
HCT: 39.5 % (ref 36.0–46.0)
HEMOGLOBIN: 13.2 g/dL (ref 12.0–15.0)
MCH: 27.1 pg (ref 26.0–34.0)
MCHC: 33.4 g/dL (ref 30.0–36.0)
MCV: 81.1 fL (ref 78.0–100.0)
Platelets: 306 10*3/uL (ref 150–400)
RBC: 4.87 MIL/uL (ref 3.87–5.11)
RDW: 13.4 % (ref 11.5–15.5)
WBC: 6.1 10*3/uL (ref 4.0–10.5)

## 2016-03-26 LAB — ACETAMINOPHEN LEVEL

## 2016-03-26 LAB — RAPID URINE DRUG SCREEN, HOSP PERFORMED
Amphetamines: NOT DETECTED
BENZODIAZEPINES: POSITIVE — AB
Barbiturates: NOT DETECTED
COCAINE: NOT DETECTED
OPIATES: NOT DETECTED
TETRAHYDROCANNABINOL: NOT DETECTED

## 2016-03-26 LAB — ETHANOL: Alcohol, Ethyl (B): <5 mg/dL (ref <5)

## 2016-03-26 LAB — SALICYLATE LEVEL

## 2016-03-26 MED ORDER — METOCLOPRAMIDE HCL 10 MG PO TABS
10.0000 mg | ORAL_TABLET | Freq: Four times a day (QID) | ORAL | Status: DC
  Administered 2016-03-26 – 2016-03-28 (×7): 10 mg via ORAL
  Filled 2016-03-26 (×7): qty 1

## 2016-03-26 MED ORDER — PANTOPRAZOLE SODIUM 40 MG PO TBEC
80.0000 mg | DELAYED_RELEASE_TABLET | Freq: Two times a day (BID) | ORAL | Status: DC
  Administered 2016-03-26 – 2016-03-28 (×5): 80 mg via ORAL
  Filled 2016-03-26 (×5): qty 2

## 2016-03-26 MED ORDER — TOPIRAMATE 25 MG PO TABS
50.0000 mg | ORAL_TABLET | Freq: Every day | ORAL | Status: DC
  Administered 2016-03-26: 50 mg via ORAL
  Filled 2016-03-26: qty 2

## 2016-03-26 MED ORDER — NALTREXONE HCL 50 MG PO TABS
25.0000 mg | ORAL_TABLET | Freq: Every day | ORAL | Status: DC
  Administered 2016-03-26 – 2016-03-27 (×2): 25 mg via ORAL
  Filled 2016-03-26 (×2): qty 1

## 2016-03-26 MED ORDER — PRAZOSIN HCL 2 MG PO CAPS
2.0000 mg | ORAL_CAPSULE | Freq: Every day | ORAL | Status: DC
  Administered 2016-03-26: 2 mg via ORAL
  Filled 2016-03-26: qty 1

## 2016-03-26 MED ORDER — NICOTINE 21 MG/24HR TD PT24
21.0000 mg | MEDICATED_PATCH | Freq: Every day | TRANSDERMAL | Status: DC
  Filled 2016-03-26 (×2): qty 1

## 2016-03-26 MED ORDER — CLONAZEPAM 1 MG PO TABS
1.0000 mg | ORAL_TABLET | Freq: Three times a day (TID) | ORAL | Status: DC
  Administered 2016-03-26 – 2016-03-28 (×5): 1 mg via ORAL
  Filled 2016-03-26 (×5): qty 1

## 2016-03-26 MED ORDER — MECLIZINE HCL 25 MG PO TABS: 50.0000 mg | ORAL_TABLET | Freq: Three times a day (TID) | ORAL | Status: DC | PRN

## 2016-03-26 MED ORDER — BACLOFEN 10 MG PO TABS: 10.0000 mg | ORAL_TABLET | Freq: Two times a day (BID) | ORAL | Status: DC

## 2016-03-26 MED ORDER — METHOCARBAMOL 500 MG PO TABS
750.0000 mg | ORAL_TABLET | Freq: Two times a day (BID) | ORAL | Status: DC
  Administered 2016-03-26 – 2016-03-28 (×4): 750 mg via ORAL
  Filled 2016-03-26 (×5): qty 2

## 2016-03-26 MED ORDER — LUBIPROSTONE 24 MCG PO CAPS
24.0000 ug | ORAL_CAPSULE | Freq: Every day | ORAL | Status: DC
  Administered 2016-03-27 – 2016-03-28 (×2): 24 ug via ORAL
  Filled 2016-03-26 (×2): qty 1

## 2016-03-26 MED ORDER — ACETAMINOPHEN 325 MG PO TABS: 650.0000 mg | ORAL_TABLET | ORAL | Status: DC | PRN

## 2016-03-26 MED ORDER — ONDANSETRON HCL 4 MG PO TABS: 4.0000 mg | ORAL_TABLET | Freq: Three times a day (TID) | ORAL | Status: DC | PRN

## 2016-03-26 MED ORDER — PROMETHAZINE HCL 25 MG PO TABS: 25.0000 mg | ORAL_TABLET | Freq: Four times a day (QID) | ORAL | Status: DC | PRN

## 2016-03-26 MED ORDER — ESTRADIOL 1 MG PO TABS
0.5000 mg | ORAL_TABLET | Freq: Every day | ORAL | Status: DC
  Administered 2016-03-27 – 2016-03-28 (×2): 0.5 mg via ORAL
  Filled 2016-03-26 (×2): qty 0.5

## 2016-03-26 MED ORDER — VENLAFAXINE HCL ER 75 MG PO CP24
225.0000 mg | ORAL_CAPSULE | Freq: Every day | ORAL | Status: DC
  Administered 2016-03-27 – 2016-03-28 (×2): 225 mg via ORAL
  Filled 2016-03-26 (×2): qty 1

## 2016-03-26 MED ORDER — BACLOFEN 20 MG PO TABS
20.0000 mg | ORAL_TABLET | Freq: Every day | ORAL | Status: DC
  Administered 2016-03-26 – 2016-03-27 (×2): 20 mg via ORAL
  Filled 2016-03-26 (×2): qty 1

## 2016-03-26 MED ORDER — LORAZEPAM 1 MG PO TABS: 1.0000 mg | ORAL_TABLET | Freq: Three times a day (TID) | ORAL | Status: DC | PRN

## 2016-03-26 MED ORDER — ALUM & MAG HYDROXIDE-SIMETH 200-200-20 MG/5ML PO SUSP: 30.0000 mL | ORAL | Status: DC | PRN

## 2016-03-26 MED ORDER — BUPROPION HCL ER (XL) 300 MG PO TB24
300.0000 mg | ORAL_TABLET | Freq: Every day | ORAL | Status: DC
  Administered 2016-03-27 – 2016-03-28 (×2): 300 mg via ORAL
  Filled 2016-03-26 (×2): qty 1

## 2016-03-26 MED ORDER — ZOLPIDEM TARTRATE 5 MG PO TABS: 5.0000 mg | ORAL_TABLET | Freq: Every evening | ORAL | Status: DC | PRN

## 2016-03-26 MED ORDER — IBUPROFEN 200 MG PO TABS: 600.0000 mg | ORAL_TABLET | Freq: Three times a day (TID) | ORAL | Status: DC | PRN

## 2016-03-26 MED ORDER — BACLOFEN 10 MG PO TABS
10.0000 mg | ORAL_TABLET | Freq: Every day | ORAL | Status: DC
  Administered 2016-03-27 – 2016-03-28 (×2): 10 mg via ORAL
  Filled 2016-03-26 (×2): qty 1

## 2016-03-26 MED ORDER — FERROUS SULFATE 325 (65 FE) MG PO TABS
325.0000 mg | ORAL_TABLET | Freq: Two times a day (BID) | ORAL | Status: DC
  Administered 2016-03-26 – 2016-03-28 (×4): 325 mg via ORAL
  Filled 2016-03-26 (×5): qty 1

## 2016-03-26 NOTE — ED Notes (Signed)
Pt said that she has been depressed since being fried from a job at [employer]. 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 She said that she has health issues that caused her to miss four days of work during her probationary period. She became distraught and depressed about it. Today she got into an argument with her daughter and that made it worse, so she felt suicidal. She told her sister, who lives in [place] that she wants to die, so her sister kept her on the telephone and called the police. Currently pt is feeling better and no longer endorses SI. She said that she would never kill herself in part because of her daughter and her many pets.

## 2016-03-26 NOTE — Progress Notes (Signed)
Disposition CSW completed patient referrals to the following inpatient psych facilities:  [facility]  CSW will continue to follow patient for placement needs.  [facility] Disposition CSW [phone]

## 2016-03-26 NOTE — ED Notes (Signed)
Patient noted in room. No complaints, stable, in no acute distress. Q15 minute rounds and monitoring via security cameras continue for safety. 

## 2016-03-26 NOTE — ED Notes (Signed)
Pt reports SI without plan related to recent job loss.
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
## 2016-03-26 NOTE — ED Provider Notes (Signed)
CSN: [id number]     Arrival date & time 03/26/16  1425 History   First MD Initiated Contact with Patient 03/26/16 1453     Chief Complaint  Patient presents with  . Suicidal     (Consider location/radiation/quality/duration/timing/severity/associated sxs/prior Treatment) HPI   Blood pressure 139/93, pulse 92, temperature 98.4 F (36.9 C), temperature source Oral, resp. rate 18, SpO2 99 %.  Tracey Estrada is a 50 y.o. female are in by police here voluntarily for suicidal ideation. Patient states that she unexpectedly lost her job last week, she has a history of depression, has been taking her medication as prescribed. She takes that her sister that she wanted to die, she has a vague plan to overdose on pills. She has no prior suicide attempts but states she used to close her eyes while driving. She denies homicidal ideation, auditory or visual hallucinations, alcohol or drug abuse. Has history of GI bleed, states that she is taking iron supplementation, denies melena, hematochezia, chest pain, shortness of breath, dyspnea on exertion, abdominal pain.  Past Medical History  Diagnosis Date  . Panic disorder   . MS (multiple sclerosis) ([place])   . Hypertension   . COPD (chronic obstructive pulmonary disease) ([place])   . Major depressive disorder ([place])   . Nonalcoholic fatty liver disease   . GERD (gastroesophageal reflux disease)   . Fibromyalgia   . PTSD (post-traumatic stress disorder)   . Allergy   . Anemia   . Blood transfusion without reported diagnosis   . DDD (degenerative disc disease), cervical    Past Surgical History  Procedure Laterality Date  . Tonsillectomy    . Cesarean section    . Hemorrhoid surgery    . Wrist surgery    . Tubal ligation    . Abdominal hysterectomy    . Esophagogastroduodenoscopy N/A 11/25/2015    Procedure: ESOPHAGOGASTRODUODENOSCOPY (EGD);  Surgeon: Irene Shipper, MD;  Location: [facility];  Service: Endoscopy;  Laterality: N/A;  . Upper  gastrointestinal endoscopy    . Rectal fissure repair    . Colonoscopy      in 1990's    Family History  Problem Relation Age of Onset  . Hypertension Mother   . Heart failure Mother   . Hypertension Sister   . Stroke Sister   . Colon cancer Sister     started in lungs- mets to brain and colon   . Lung cancer Sister   . Colon cancer Paternal Grandfather   . Colon polyps Neg Hx   . Esophageal cancer Neg Hx   . Rectal cancer Neg Hx   . Stomach cancer Neg Hx   . Colon cancer Other     pat.great aunt    Social History  Substance Use Topics  . Smoking status: Former Smoker    Quit date: 11/24/2012  . Smokeless tobacco: Never Used  . Alcohol Use: No   OB History    No data available     Review of Systems  10 systems reviewed and found to be negative, except as noted in the HPI.   Allergies  Other  Home Medications   Prior to Admission medications   Medication Sig Start Date End Date Taking? Authorizing Provider  baclofen (LIORESAL) 10 MG tablet Take 10-20 mg by mouth 2 (two) times daily. 10 mg in am and 20 mg at bed time   Yes Historical Provider, MD  buPROPion (WELLBUTRIN XL) 300 MG 24 hr tablet Take 300 mg by mouth  daily.   Yes Historical Provider, MD  clonazePAM (KLONOPIN) 1 MG tablet Take 1 mg by mouth 3 (three) times daily.   Yes Historical Provider, MD  estradiol (ESTRACE) 0.5 MG tablet Take 0.5 mg by mouth daily.   Yes Historical Provider, MD  ferrous sulfate 325 (65 FE) MG EC tablet Take 1 tablet (325 mg total) by mouth 2 (two) times daily with a meal. 11/26/15  Yes Standley Brooking, MD  lubiprostone (AMITIZA) 24 MCG capsule Take 24 mcg by mouth daily with breakfast.   Yes Historical Provider, MD  meclizine (ANTIVERT) 50 MG tablet Take 1 tablet (50 mg total) by mouth 3 (three) times daily as needed. Patient taking differently: Take 50 mg by mouth 3 (three) times daily as needed. Dizziness. 09/03/12  Yes Jerelyn Scott, MD  methocarbamol (ROBAXIN) 750 MG tablet Take  750 mg by mouth 2 (two) times daily.   Yes Historical Provider, MD  metoCLOPramide (REGLAN) 10 MG tablet Take 10 mg by mouth 4 (four) times daily.   Yes Historical Provider, MD  naltrexone (DEPADE) 50 MG tablet Take 25 mg by mouth at bedtime.   Yes Historical Provider, MD  pantoprazole (PROTONIX) 40 MG tablet Take 80 mg by mouth 2 (two) times daily. Reported on 11/24/2015   Yes Historical Provider, MD  prazosin (MINIPRESS) 2 MG capsule Take 1 capsule (2 mg total) by mouth at bedtime. 11/26/15  Yes Standley Brooking, MD  promethazine (PHENERGAN) 25 MG tablet Take 25 mg by mouth every 6 (six) hours as needed for nausea or vomiting.   Yes Historical Provider, MD  topiramate (TOPAMAX) 50 MG tablet Take 1 tablet (50 mg total) by mouth at bedtime. Patient taking differently: Take 100 mg by mouth at bedtime.  11/26/15  Yes Standley Brooking, MD  Venlafaxine HCl (EFFEXOR XR PO) Take 225 mg by mouth daily.   Yes Historical Provider, MD  Na Sulfate-K Sulfate-Mg Sulf (SUPREP BOWEL PREP KIT) 17.5-3.13-1.6 GM/180ML SOLN Take 1 kit by mouth once. suprep as directed. No substitutions Patient not taking: Reported on 03/26/2016 03/22/16   Hilarie Fredrickson, MD   BP 139/93 mmHg  Pulse 92  Temp(Src) 98.4 F (36.9 C) (Oral)  Resp 18  SpO2 99% Physical Exam  Constitutional: She is oriented to person, place, and time. She appears well-developed and well-nourished. No distress.  HENT:  Head: Normocephalic and atraumatic.  Mouth/Throat: Oropharynx is clear and moist.  Eyes: Conjunctivae and EOM are normal. Pupils are equal, round, and reactive to light.  Neck: Normal range of motion.  Cardiovascular: Normal rate, regular rhythm and intact distal pulses.   Pulmonary/Chest: Effort normal and breath sounds normal.  Abdominal: Soft. There is no tenderness.  Musculoskeletal: Normal range of motion.  Neurological: She is alert and oriented to person, place, and time.  Skin: She is not diaphoretic.  Psychiatric: She has a normal  mood and affect. Her speech is normal and behavior is normal. She expresses suicidal ideation. She expresses no homicidal ideation. She expresses suicidal plans. She expresses no homicidal plans.  Nursing note and vitals reviewed.   ED Course  Procedures (including critical care time) Labs Review Labs Reviewed  COMPREHENSIVE METABOLIC PANEL - Abnormal; Notable for the following:    Chloride 113 (*)    CO2 21 (*)    AST 11 (*)    ALT 10 (*)    All other components within normal limits  ACETAMINOPHEN LEVEL - Abnormal; Notable for the following:    Acetaminophen (Tylenol),  Serum <10 (*)    All other components within normal limits  URINE RAPID DRUG SCREEN, HOSP PERFORMED - Abnormal; Notable for the following:    Benzodiazepines POSITIVE (*)    All other components within normal limits  ETHANOL  SALICYLATE LEVEL  CBC    Imaging Review No results found. I have personally reviewed and evaluated these images and lab results as part of my medical decision-making.   EKG Interpretation None      MDM   Final diagnoses:  Suicidal ideation    Filed Vitals:   03/26/16 1432  BP: 139/93  Pulse: 92  Temp: 98.4 F (36.9 C)  TempSrc: Oral  Resp: 18  SpO2: 99%    Medications  baclofen (LIORESAL) tablet 10-20 mg (not administered)  buPROPion (WELLBUTRIN XL) 24 hr tablet 300 mg (not administered)  clonazePAM (KLONOPIN) tablet 1 mg (not administered)  estradiol (ESTRACE) tablet 0.5 mg (not administered)  ferrous sulfate EC tablet 325 mg (not administered)  lubiprostone (AMITIZA) capsule 24 mcg (not administered)  meclizine (ANTIVERT) tablet 50 mg (not administered)  methocarbamol (ROBAXIN) tablet 750 mg (not administered)  metoCLOPramide (REGLAN) tablet 10 mg (not administered)  naltrexone (DEPADE) tablet 25 mg (not administered)  pantoprazole (PROTONIX) EC tablet 80 mg (not administered)  prazosin (MINIPRESS) capsule 2 mg (not administered)  promethazine (PHENERGAN) tablet  25 mg (not administered)  topiramate (TOPAMAX) tablet 50 mg (not administered)  venlafaxine XR (EFFEXOR-XR) 24 hr capsule 225 mg (not administered)  alum & mag hydroxide-simeth (MAALOX/MYLANTA) 200-200-20 MG/5ML suspension 30 mL (not administered)  ondansetron (ZOFRAN) tablet 4 mg (not administered)  nicotine (NICODERM CQ - dosed in mg/24 hours) patch 21 mg (not administered)  zolpidem (AMBIEN) tablet 5 mg (not administered)  ibuprofen (ADVIL,MOTRIN) tablet 600 mg (not administered)  acetaminophen (TYLENOL) tablet 650 mg (not administered)  LORazepam (ATIVAN) tablet 1 mg (not administered)    Tracey Estrada is 50 y.o. female presenting with Suicidal ideation, vague plan to overdose. History of depression.  Patient is medically cleared for psychiatric evaluation will be transferred to the psych ED. TTS consulted, home meds and psych standard holding orders placed.      Monico Blitz, PA-C 03/26/16 Hansen Yao, MD 03/26/16 [phone]

## 2016-03-27 ENCOUNTER — Encounter (HOSPITAL_COMMUNITY): Payer: Self-pay | Admitting: Registered Nurse

## 2016-03-27 DIAGNOSIS — R45851 Suicidal ideations: Secondary | ICD-10-CM

## 2016-03-27 DIAGNOSIS — F332 Major depressive disorder, recurrent severe without psychotic features: Secondary | ICD-10-CM

## 2016-03-27 DIAGNOSIS — F411 Generalized anxiety disorder: Secondary | ICD-10-CM | POA: Diagnosis present

## 2016-03-27 DIAGNOSIS — F4310 Post-traumatic stress disorder, unspecified: Secondary | ICD-10-CM

## 2016-03-27 MED ORDER — TRAZODONE HCL 50 MG PO TABS: 50.0000 mg | ORAL_TABLET | Freq: Every evening | ORAL | Status: DC | PRN

## 2016-03-27 MED ORDER — TOPIRAMATE 100 MG PO TABS
100.0000 mg | ORAL_TABLET | Freq: Every day | ORAL | Status: DC
  Administered 2016-03-27: 100 mg via ORAL
  Filled 2016-03-27: qty 1

## 2016-03-27 MED ORDER — PRAZOSIN HCL 5 MG PO CAPS
5.0000 mg | ORAL_CAPSULE | Freq: Every day | ORAL | Status: DC
  Administered 2016-03-27: 5 mg via ORAL
  Filled 2016-03-27: qty 1

## 2016-03-27 NOTE — Consult Note (Signed)
[facility] Face-to-Face Psychiatry Consult   Reason for Consult:  Depression and suicidal thoughts Referring Physician:  EDP Patient Identification: Tracey Estrada MRN:  [id number] Principal Diagnosis: MDD (major depressive disorder), recurrent severe, without psychosis ([place]) Diagnosis:   Patient Active Problem List   Diagnosis Date Noted  . MDD (major depressive disorder), recurrent severe, without psychosis ([place]) [F33.2] 03/27/2016  . PTSD (post-traumatic stress disorder) [F43.10] 03/27/2016  . GAD (generalized anxiety disorder) [F41.1] 03/27/2016  . MDD (major depressive disorder), recurrent, severe, with psychosis ([place]) [F33.3] 11/26/2015  . Anemia, iron deficiency [D50.9]   . Heme positive stool [R19.5]   . Depression [F32.9] 11/24/2015  . Multiple sclerosis ([place]) [G35] 11/24/2015  . Anemia [D64.9] 11/24/2015  . Acute blood loss anemia [D62] 11/24/2015  . Symptomatic anemia [D64.9] 11/24/2015    Total Time spent with patient: 30 minutes  Subjective:   Tracey Estrada is a 50 y.o. female patient presented to [facility] with complaints of suicidal thoughts.  HPI:  Tracey Estrada 50 y.o. female patient seen by this provider and chart reviewed 03/27/2016.    On evaluation:  Tracey Estrada reports that was feeling depressed related to losing her job.  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  After her 90 days was up during the evaluation informed her they would have to let her go because "they needed someone that they can depend on."  States that her daughter "Mateo Flow" has also left.  States that she called her sister and told her that she wanted to die."  Patient sister called the police and police brought to hospital.  Reports that she does not want to die and that the statement was made out of frustration.  "I would never leave her (daughter) how selfish of me would that be."  At this time patient denies suicidal/homicidal ideation;  psychosis, and paranoia.  Patient has outpatient services at [facility].      Past Psychiatric History: Major Depressive Disorder  Risk to Self: Suicidal Ideation: Yes-Currently Present Suicidal Intent: No Is patient at risk for suicide?: Yes Suicidal Plan?: Yes-Currently Present Specify Current Suicidal Plan: overdose on medication Access to Means: Yes Specify Access to Suicidal Means: own pills What has been your use of drugs/alcohol within the last 12 months?: pt denies any use Intentional Self Injurious Behavior: None Risk to Others: Homicidal Ideation: No Thoughts of Harm to Others: No Current Homicidal Intent: No Current Homicidal Plan: No Access to Homicidal Means: No History of harm to others?: No Assessment of Violence: None Noted Does patient have access to weapons?: Yes (Comment) (pt owns a handgun) Criminal Charges Pending?: No Does patient have a court date: No Prior Inpatient Therapy: Prior Inpatient Therapy: Yes Prior Therapy Dates: 2002 Prior Therapy Facilty/Provider(s): [facility] Reason for Treatment: psych Prior Outpatient Therapy: Prior Outpatient Therapy: Yes Prior Therapy Dates: current Prior Therapy Facilty/Provider(s): Dr Darleene Cleaver (has had counseling in past as well) Reason for Treatment: meds Does patient have an ACCT team?: No Does patient have Intensive In-House Services?  : No Does patient have [facility] services? : No Does patient have [facility] services?: No  Past Medical History:  Past Medical History  Diagnosis Date  . Panic disorder   . MS (multiple sclerosis) ([place])   . Hypertension   . COPD (chronic obstructive pulmonary disease) ([place])   . Major depressive disorder ([place])   . Nonalcoholic fatty liver disease   .  GERD (gastroesophageal reflux disease)   . Fibromyalgia   . PTSD (post-traumatic stress disorder)   . Allergy   . Anemia   . Blood transfusion without reported diagnosis   . DDD (degenerative disc  disease), cervical     Past Surgical History  Procedure Laterality Date  . Tonsillectomy    . Cesarean section    . Hemorrhoid surgery    . Wrist surgery    . Tubal ligation    . Abdominal hysterectomy    . Esophagogastroduodenoscopy N/A 11/25/2015    Procedure: ESOPHAGOGASTRODUODENOSCOPY (EGD);  Surgeon: Irene Shipper, MD;  Location: [facility];  Service: Endoscopy;  Laterality: N/A;  . Upper gastrointestinal endoscopy    . Rectal fissure repair    . Colonoscopy      in 1990's    Family History:  Family History  Problem Relation Age of Onset  . Hypertension Mother   . Heart failure Mother   . Hypertension Sister   . Stroke Sister   . Colon cancer Sister     started in lungs- mets to brain and colon   . Lung cancer Sister   . Colon cancer Paternal Grandfather   . Colon polyps Neg Hx   . Esophageal cancer Neg Hx   . Rectal cancer Neg Hx   . Stomach cancer Neg Hx   . Colon cancer Other     pat.great aunt    Family Psychiatric  History: Denies  Social History:  History  Alcohol Use No     History  Drug Use No    Social History   Social History  . Marital Status: Divorced    Spouse Name: N/A  . Number of Children: N/A  . Years of Education: N/A   Social History Main Topics  . Smoking status: Former Smoker    Quit date: 11/24/2012  . Smokeless tobacco: Never Used  . Alcohol Use: No  . Drug Use: No  . Sexual Activity: Yes   Other Topics Concern  . None   Social History Narrative   Additional Social History:    Allergies:   Allergies  Allergen Reactions  . Other Hives and Shortness Of Breath    Anything with steroid causes psychosis.   Pt reports taking in March 2017 without incident.      Labs:  Results for orders placed or performed during the hospital encounter of 03/26/16 (from the past 48 hour(s))  Rapid urine drug screen (hospital performed)     Status: Abnormal   Collection Time: 03/26/16  3:01 PM  Result Value Ref Range   Opiates NONE  DETECTED NONE DETECTED   Cocaine NONE DETECTED NONE DETECTED   Benzodiazepines POSITIVE (A) NONE DETECTED   Amphetamines NONE DETECTED NONE DETECTED   Tetrahydrocannabinol NONE DETECTED NONE DETECTED   Barbiturates NONE DETECTED NONE DETECTED    Comment:        DRUG SCREEN FOR MEDICAL PURPOSES ONLY.  IF CONFIRMATION IS NEEDED FOR ANY PURPOSE, NOTIFY LAB WITHIN 5 DAYS.        LOWEST DETECTABLE LIMITS FOR URINE DRUG SCREEN Drug Class       Cutoff (ng/mL) Amphetamine      1000 Barbiturate      200 Benzodiazepine   622 Tricyclics       633 Opiates          300 Cocaine          300 THC  50   Ethanol     Status: None   Collection Time: 03/26/16  3:02 PM  Result Value Ref Range   Alcohol, Ethyl (B) <5 <5 mg/dL    Comment:        LOWEST DETECTABLE LIMIT FOR SERUM ALCOHOL IS 5 mg/dL FOR MEDICAL PURPOSES ONLY   Salicylate level     Status: None   Collection Time: 03/26/16  3:02 PM  Result Value Ref Range   Salicylate Lvl <1.2 2.8 - 30.0 mg/dL  Acetaminophen level     Status: Abnormal   Collection Time: 03/26/16  3:02 PM  Result Value Ref Range   Acetaminophen (Tylenol), Serum <10 (L) 10 - 30 ug/mL    Comment:        THERAPEUTIC CONCENTRATIONS VARY SIGNIFICANTLY. A RANGE OF 10-30 ug/mL MAY BE AN EFFECTIVE CONCENTRATION FOR MANY PATIENTS. HOWEVER, SOME ARE BEST TREATED AT CONCENTRATIONS OUTSIDE THIS RANGE. ACETAMINOPHEN CONCENTRATIONS >150 ug/mL AT 4 HOURS AFTER INGESTION AND >50 ug/mL AT 12 HOURS AFTER INGESTION ARE OFTEN ASSOCIATED WITH TOXIC REACTIONS.   Comprehensive metabolic panel     Status: Abnormal   Collection Time: 03/26/16  3:03 PM  Result Value Ref Range   Sodium 140 135 - 145 mmol/L   Potassium 3.5 3.5 - 5.1 mmol/L   Chloride 113 (H) 101 - 111 mmol/L   CO2 21 (L) 22 - 32 mmol/L   Glucose, Bld 98 65 - 99 mg/dL   BUN 7 6 - 20 mg/dL   Creatinine, Ser 0.64 0.44 - 1.00 mg/dL   Calcium 9.1 8.9 - 10.3 mg/dL   Total Protein 7.3 6.5 - 8.1 g/dL    Albumin 4.1 3.5 - 5.0 g/dL   AST 11 (L) 15 - 41 U/L   ALT 10 (L) 14 - 54 U/L   Alkaline Phosphatase 84 38 - 126 U/L   Total Bilirubin 0.6 0.3 - 1.2 mg/dL   GFR calc non Af Amer >60 >60 mL/min   GFR calc Af Amer >60 >60 mL/min    Comment: (NOTE) The eGFR has been calculated using the CKD EPI equation. This calculation has not been validated in all clinical situations. eGFR's persistently <60 mL/min signify possible Chronic Kidney Disease.    Anion gap 6 5 - 15  cbc     Status: None   Collection Time: 03/26/16  3:03 PM  Result Value Ref Range   WBC 6.1 4.0 - 10.5 K/uL   RBC 4.87 3.87 - 5.11 MIL/uL   Hemoglobin 13.2 12.0 - 15.0 g/dL   HCT 39.5 36.0 - 46.0 %   MCV 81.1 78.0 - 100.0 fL   MCH 27.1 26.0 - 34.0 pg   MCHC 33.4 30.0 - 36.0 g/dL   RDW 13.4 11.5 - 15.5 %   Platelets 306 150 - 400 K/uL    Current Facility-Administered Medications  Medication Dose Route Frequency Provider Last Rate Last Dose  . acetaminophen (TYLENOL) tablet 650 mg  650 mg Oral Q4H PRN Nicole Pisciotta, PA-C      . alum & mag hydroxide-simeth (MAALOX/MYLANTA) 200-200-20 MG/5ML suspension 30 mL  30 mL Oral PRN Nicole Pisciotta, PA-C      . baclofen (LIORESAL) tablet 10 mg  10 mg Oral Daily Wandra Arthurs, MD   10 mg at 03/27/16 0944   And  . baclofen (LIORESAL) tablet 20 mg  20 mg Oral QHS Wandra Arthurs, MD   20 mg at 03/26/16 2112  . buPROPion (WELLBUTRIN XL) 24 hr tablet 300  mg  300 mg Oral Daily Nicole Pisciotta, PA-C   300 mg at 03/27/16 0156  . clonazePAM (KLONOPIN) tablet 1 mg  1 mg Oral TID Wynetta Emery, PA-C   1 mg at 03/27/16 0939  . estradiol (ESTRACE) tablet 0.5 mg  0.5 mg Oral Daily Nicole Pisciotta, PA-C   0.5 mg at 03/27/16 3493  . ferrous sulfate tablet 325 mg  325 mg Oral BID WC Nicole Pisciotta, PA-C   325 mg at 03/27/16 0850  . ibuprofen (ADVIL,MOTRIN) tablet 600 mg  600 mg Oral Q8H PRN Wynetta Emery, PA-C      . LORazepam (ATIVAN) tablet 1 mg  1 mg Oral Q8H PRN Nicole Pisciotta, PA-C       . lubiprostone (AMITIZA) capsule 24 mcg  24 mcg Oral Q breakfast Nicole Pisciotta, PA-C   24 mcg at 03/27/16 0850  . meclizine (ANTIVERT) tablet 50 mg  50 mg Oral TID PRN Wynetta Emery, PA-C      . methocarbamol (ROBAXIN) tablet 750 mg  750 mg Oral BID Nicole Pisciotta, PA-C   750 mg at 03/27/16 8306  . metoCLOPramide (REGLAN) tablet 10 mg  10 mg Oral QID Nicole Pisciotta, PA-C   10 mg at 03/27/16 1355  . naltrexone (DEPADE) tablet 25 mg  25 mg Oral QHS Nicole Pisciotta, PA-C   25 mg at 03/26/16 2128  . nicotine (NICODERM CQ - dosed in mg/24 hours) patch 21 mg  21 mg Transdermal Daily Nicole Pisciotta, PA-C   21 mg at 03/26/16 1721  . ondansetron (ZOFRAN) tablet 4 mg  4 mg Oral Q8H PRN Nicole Pisciotta, PA-C      . pantoprazole (PROTONIX) EC tablet 80 mg  80 mg Oral BID Nicole Pisciotta, PA-C   80 mg at 03/27/16 7882  . prazosin (MINIPRESS) capsule 5 mg  5 mg Oral QHS Juandavid Dallman, MD      . promethazine (PHENERGAN) tablet 25 mg  25 mg Oral Q6H PRN Nicole Pisciotta, PA-C      . topiramate (TOPAMAX) tablet 100 mg  100 mg Oral QHS Roemello Speyer, MD      . traZODone (DESYREL) tablet 50 mg  50 mg Oral QHS PRN Chole Driver, MD      . venlafaxine XR (EFFEXOR-XR) 24 hr capsule 225 mg  225 mg Oral Daily Nicole Pisciotta, PA-C   225 mg at 03/27/16 1378   Current Outpatient Prescriptions  Medication Sig Dispense Refill  . baclofen (LIORESAL) 10 MG tablet Take 10-20 mg by mouth 2 (two) times daily. 10 mg in am and 20 mg at bed time    . buPROPion (WELLBUTRIN XL) 300 MG 24 hr tablet Take 300 mg by mouth daily.    . clonazePAM (KLONOPIN) 1 MG tablet Take 1 mg by mouth 3 (three) times daily.    Marland Kitchen estradiol (ESTRACE) 0.5 MG tablet Take 0.5 mg by mouth daily.    . ferrous sulfate 325 (65 FE) MG EC tablet Take 1 tablet (325 mg total) by mouth 2 (two) times daily with a meal.    . lubiprostone (AMITIZA) 24 MCG capsule Take 24 mcg by mouth daily with breakfast.    . meclizine (ANTIVERT) 50 MG tablet  Take 1 tablet (50 mg total) by mouth 3 (three) times daily as needed. (Patient taking differently: Take 50 mg by mouth 3 (three) times daily as needed. Dizziness.) 30 tablet 0  . methocarbamol (ROBAXIN) 750 MG tablet Take 750 mg by mouth 2 (two) times daily.    Marland Kitchen  metoCLOPramide (REGLAN) 10 MG tablet Take 10 mg by mouth 4 (four) times daily.    . naltrexone (DEPADE) 50 MG tablet Take 25 mg by mouth at bedtime.    . pantoprazole (PROTONIX) 40 MG tablet Take 80 mg by mouth 2 (two) times daily. Reported on 11/24/2015    . prazosin (MINIPRESS) 2 MG capsule Take 1 capsule (2 mg total) by mouth at bedtime. 30 capsule 0  . promethazine (PHENERGAN) 25 MG tablet Take 25 mg by mouth every 6 (six) hours as needed for nausea or vomiting.    . topiramate (TOPAMAX) 50 MG tablet Take 1 tablet (50 mg total) by mouth at bedtime. (Patient taking differently: Take 100 mg by mouth at bedtime. )    . Venlafaxine HCl (EFFEXOR XR PO) Take 225 mg by mouth daily.    . Na Sulfate-K Sulfate-Mg Sulf (SUPREP BOWEL PREP KIT) 17.5-3.13-1.6 GM/180ML SOLN Take 1 kit by mouth once. suprep as directed. No substitutions (Patient not taking: Reported on 03/26/2016) 354 mL 0    Musculoskeletal: Strength & Muscle Tone: within normal limits Gait & Station: normal Patient leans: N/A  Psychiatric Specialty Exam: Physical Exam  Nursing note and vitals reviewed. Constitutional: She is oriented to person, place, and time.  Neck: Normal range of motion.  Respiratory: Effort normal.  Neurological: She is alert and oriented to person, place, and time.  Skin: Skin is warm and dry.  Psychiatric: Her speech is normal and behavior is normal. Her mood appears anxious. Thought content is not paranoid and not delusional. Cognition and memory are normal. She expresses impulsivity. She exhibits a depressed mood. She expresses no homicidal and no suicidal ideation.    Review of Systems  Psychiatric/Behavioral: Positive for depression. Negative for  suicidal ideas, memory loss and substance abuse. The patient is nervous/anxious and has insomnia.     Blood pressure 123/60, pulse 68, temperature 98.1 F (36.7 C), temperature source Oral, resp. rate 20, SpO2 100 %.There is no weight on file to calculate BMI.  General Appearance: Disheveled  Eye Contact:  Good  Speech:  Clear and Coherent and Normal Rate  Volume:  Normal  Mood:  Depressed  Affect:  Congruent  Thought Process:  Coherent and Goal Directed  Orientation:  Full (Time, Place, and Person)  Thought Content:  Logical  Suicidal Thoughts:  No  Homicidal Thoughts:  No  Memory:  Immediate;   Good Recent;   Good Remote;   Good  Judgement:  Intact  Insight:  Present  Psychomotor Activity:  Normal  Concentration:  Concentration: Fair and Attention Span: Fair  Recall:  Good  Fund of Knowledge:  Good  Language:  Good  Akathisia:  No  Handed:  Right  AIMS (if indicated):     Assets:  Communication Skills Desire for Improvement Housing Social Support  ADL's:  Intact  Cognition:  WNL  Sleep:        Treatment Plan Summary: Plan Possible discharge home tomorrow if no significant changes  Home medications started  Disposition: Will Reassess tomorrow; possible discharge home to follow up with her primary psych provider    Earleen Newport, NP 03/27/2016 4:11 PM Patient seen face-to-face for psychiatric evaluation, chart reviewed and case discussed with the physician extender and developed treatment plan. Reviewed the information documented and agree with the treatment plan. Corena Pilgrim, MD

## 2016-03-27 NOTE — BH Assessment (Signed)
Per Clydie Braun at [facility], pt has been accepted to the services of Dr. Regino Schultze. Pt can be transported Monday morning to [address] when the bed will be available. Report can be called to [phone].

## 2016-03-27 NOTE — Progress Notes (Addendum)
Report received by Lincoln Maxin, RN.Pt is waiting to be seen by Dr. Jannifer Franklin for a disposition.(1:30pm) Pt is pleasant and cooperative. Pt requested a soda. 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 Pt did voice disappointment in the way her nurse manager fired her due to missing to much work. Pt stated, "I can not go to to [facility] because I have a 52 year old I have to take care of." 3:15pm Phoned [facility] and left a voicemail for [place] to phone back concerning a pt who will not be coming to their facility in the am. Pt discussed with the writer the stressors of having a 9 year old daughter. Pt did not feel she needed her afternoon clonipine and stated she feels very calm and relaxed. 6pm -Pt ate 100% of her dinner. Report to oncoming shift. (6:50pm)

## 2016-03-27 NOTE — ED Notes (Signed)
Patient noted in room. No complaints, stable, in no acute distress. Q15 minute rounds and monitoring via security cameras continue for safety. Pt gave permission to talk to sister, and report given. Pt calm and resting in bed.

## 2016-03-28 NOTE — Consult Note (Signed)
[facility] Face-to-Face Psychiatry Consult   Reason for Consult:  Suicidal ideations Referring Physician:  EDP Patient Identification: Tracey Estrada MRN:  [id number] Principal Diagnosis: MDD (major depressive disorder), recurrent severe, without psychosis ([place]) Diagnosis:   Patient Active Problem List   Diagnosis Date Noted  . MDD (major depressive disorder), recurrent severe, without psychosis ([place]) [F33.2] 03/27/2016    Priority: High  . PTSD (post-traumatic stress disorder) [F43.10] 03/27/2016    Priority: High  . GAD (generalized anxiety disorder) [F41.1] 03/27/2016    Priority: High  . Suicidal ideation [R45.851]   . MDD (major depressive disorder), recurrent, severe, with psychosis ([place]) [F33.3] 11/26/2015  . Anemia, iron deficiency [D50.9]   . Heme positive stool [R19.5]   . Depression [F32.9] 11/24/2015  . Multiple sclerosis ([place]) [G35] 11/24/2015  . Anemia [D64.9] 11/24/2015  . Acute blood loss anemia [D62] 11/24/2015  . Symptomatic anemia [D64.9] 11/24/2015    Total Time spent with patient: 30 minutes  Subjective:   Tracey Estrada is a 50 y.o. female patient does not warrant admission.  HPI:  50 yo female who presented to the ED with suicidal ideations and depression after losing her job.  Her medications were adjusted and she stabilized.  She has denied suicidal/homicidal ideations for the past 24 hours, no hallucinations or alcohol/drug abuse.  Tracey Estrada will follow-up with neuropsychiatry, supportive neighbors of 34 years and her pets.  Stable for discharge.  Past Psychiatric History: Depression  Risk to Self: Suicidal Ideation: Yes-Currently Present Suicidal Intent: No Is patient at risk for suicide?: Yes Suicidal Plan?: Yes-Currently Present Specify Current Suicidal Plan: overdose on medication Access to Means: Yes Specify Access to Suicidal Means: own pills What has been your use of drugs/alcohol within the last 12 months?: pt denies any use Intentional Self  Injurious Behavior: None Risk to Others: Homicidal Ideation: No Thoughts of Harm to Others: No Current Homicidal Intent: No Current Homicidal Plan: No Access to Homicidal Means: No History of harm to others?: No Assessment of Violence: None Noted Does patient have access to weapons?: Yes (Comment) (pt owns a handgun) Criminal Charges Pending?: No Does patient have a court date: No Prior Inpatient Therapy: Prior Inpatient Therapy: Yes Prior Therapy Dates: 2002 Prior Therapy Facilty/Provider(s): [facility] Reason for Treatment: psych Prior Outpatient Therapy: Prior Outpatient Therapy: Yes Prior Therapy Dates: current Prior Therapy Facilty/Provider(s): Dr Darleene Cleaver (has had counseling in past as well) Reason for Treatment: meds Does patient have an ACCT team?: No Does patient have Intensive In-House Services?  : No Does patient have [facility] services? : No Does patient have P4CC services?: No  Past Medical History:  Past Medical History  Diagnosis Date  . Panic disorder   . MS (multiple sclerosis) ([place])   . Hypertension   . COPD (chronic obstructive pulmonary disease) ([place])   . Major depressive disorder ([place])   . Nonalcoholic fatty liver disease   . GERD (gastroesophageal reflux disease)   . Fibromyalgia   . PTSD (post-traumatic stress disorder)   . Allergy   . Anemia   . Blood transfusion without reported diagnosis   . DDD (degenerative disc disease), cervical     Past Surgical History  Procedure Laterality Date  . Tonsillectomy    . Cesarean section    . Hemorrhoid surgery    . Wrist surgery    . Tubal ligation    . Abdominal hysterectomy    . Esophagogastroduodenoscopy N/A 11/25/2015    Procedure: ESOPHAGOGASTRODUODENOSCOPY (EGD);  Surgeon: Irene Shipper,  MD;  Location: [facility] END[facility]COPY;  Service: Endoscopy;  Laterality: N/A;  . Upper gastrointestinal endoscopy    . Rectal fissure repair    . Colonoscopy      in 1990's    Family History:  Family History   Problem Relation Age of Onset  . Hypertension Mother   . Heart failure Mother   . Hypertension Sister   . Stroke Sister   . Colon cancer Sister     started in lungs- mets to brain and colon   . Lung cancer Sister   . Colon cancer Paternal Grandfather   . Colon polyps Neg Hx   . Esophageal cancer Neg Hx   . Rectal cancer Neg Hx   . Stomach cancer Neg Hx   . Colon cancer Other     pat.great aunt    Family Psychiatric  History: none Social History:  History  Alcohol Use No     History  Drug Use No    Social History   Social History  . Marital Status: Divorced    Spouse Name: N/A  . Number of Children: N/A  . Years of Education: N/A   Social History Main Topics  . Smoking status: Former Smoker    Quit date: 11/24/2012  . Smokeless tobacco: Never Used  . Alcohol Use: No  . Drug Use: No  . Sexual Activity: Yes   Other Topics Concern  . None   Social History Narrative   Additional Social History:    Allergies:   Allergies  Allergen Reactions  . Other Hives and Shortness Of Breath    Anything with steroid causes psychosis.   Pt reports taking in March 2017 without incident.      Labs:  Results for orders placed or performed during the hospital encounter of 03/26/16 (from the past 48 hour(s))  Rapid urine drug screen (hospital performed)     Status: Abnormal   Collection Time: 03/26/16  3:01 PM  Result Value Ref Range   Opiates NONE DETECTED NONE DETECTED   Cocaine NONE DETECTED NONE DETECTED   Benzodiazepines POSITIVE (A) NONE DETECTED   Amphetamines NONE DETECTED NONE DETECTED   Tetrahydrocannabinol NONE DETECTED NONE DETECTED   Barbiturates NONE DETECTED NONE DETECTED    Comment:        DRUG SCREEN FOR MEDICAL PURPOSES ONLY.  IF CONFIRMATION IS NEEDED FOR ANY PURPOSE, NOTIFY LAB WITHIN 5 DAYS.        LOWEST DETECTABLE LIMITS FOR URINE DRUG SCREEN Drug Class       Cutoff (ng/mL) Amphetamine      1000 Barbiturate      200 Benzodiazepine    200 Tricyclics       300 Opiates          300 Cocaine          300 THC              50   Ethanol     Status: None   Collection Time: 03/26/16  3:02 PM  Result Value Ref Range   Alcohol, Ethyl (B) <5 <5 mg/dL    Comment:        LOWEST DETECTABLE LIMIT FOR SERUM ALCOHOL IS 5 mg/dL FOR MEDICAL PURPOSES ONLY   Salicylate level     Status: None   Collection Time: 03/26/16  3:02 PM  Result Value Ref Range   Salicylate Lvl <4.0 2.8 - 30.0 mg/dL  Acetaminophen level     Status: Abnormal   Collection Time:  03/26/16  3:02 PM  Result Value Ref Range   Acetaminophen (Tylenol), Serum <10 (L) 10 - 30 ug/mL    Comment:        THERAPEUTIC CONCENTRATIONS VARY SIGNIFICANTLY. A RANGE OF 10-30 ug/mL MAY BE AN EFFECTIVE CONCENTRATION FOR MANY PATIENTS. HOWEVER, SOME ARE BEST TREATED AT CONCENTRATIONS OUTSIDE THIS RANGE. ACETAMINOPHEN CONCENTRATIONS >150 ug/mL AT 4 HOURS AFTER INGESTION AND >50 ug/mL AT 12 HOURS AFTER INGESTION ARE OFTEN ASSOCIATED WITH TOXIC REACTIONS.   Comprehensive metabolic panel     Status: Abnormal   Collection Time: 03/26/16  3:03 PM  Result Value Ref Range   Sodium 140 135 - 145 mmol/L   Potassium 3.5 3.5 - 5.1 mmol/L   Chloride 113 (H) 101 - 111 mmol/L   CO2 21 (L) 22 - 32 mmol/L   Glucose, Bld 98 65 - 99 mg/dL   BUN 7 6 - 20 mg/dL   Creatinine, Ser 0.64 0.44 - 1.00 mg/dL   Calcium 9.1 8.9 - 10.3 mg/dL   Total Protein 7.3 6.5 - 8.1 g/dL   Albumin 4.1 3.5 - 5.0 g/dL   AST 11 (L) 15 - 41 U/L   ALT 10 (L) 14 - 54 U/L   Alkaline Phosphatase 84 38 - 126 U/L   Total Bilirubin 0.6 0.3 - 1.2 mg/dL   GFR calc non Af Amer >60 >60 mL/min   GFR calc Af Amer >60 >60 mL/min    Comment: (NOTE) The eGFR has been calculated using the CKD EPI equation. This calculation has not been validated in all clinical situations. eGFR's persistently <60 mL/min signify possible Chronic Kidney Disease.    Anion gap 6 5 - 15  cbc     Status: None   Collection Time: 03/26/16   3:03 PM  Result Value Ref Range   WBC 6.1 4.0 - 10.5 K/uL   RBC 4.87 3.87 - 5.11 MIL/uL   Hemoglobin 13.2 12.0 - 15.0 g/dL   HCT 39.5 36.0 - 46.0 %   MCV 81.1 78.0 - 100.0 fL   MCH 27.1 26.0 - 34.0 pg   MCHC 33.4 30.0 - 36.0 g/dL   RDW 13.4 11.5 - 15.5 %   Platelets 306 150 - 400 K/uL    Current Facility-Administered Medications  Medication Dose Route Frequency Provider Last Rate Last Dose  . acetaminophen (TYLENOL) tablet 650 mg  650 mg Oral Q4H PRN Nicole Pisciotta, PA-C      . alum & mag hydroxide-simeth (MAALOX/MYLANTA) 200-200-20 MG/5ML suspension 30 mL  30 mL Oral PRN Nicole Pisciotta, PA-C      . baclofen (LIORESAL) tablet 10 mg  10 mg Oral Daily Wandra Arthurs, MD   10 mg at 03/28/16 0272   And  . baclofen (LIORESAL) tablet 20 mg  20 mg Oral QHS Wandra Arthurs, MD   20 mg at 03/27/16 2144  . buPROPion (WELLBUTRIN XL) 24 hr tablet 300 mg  300 mg Oral Daily Nicole Pisciotta, PA-C   300 mg at 03/28/16 0907  . clonazePAM (KLONOPIN) tablet 1 mg  1 mg Oral TID Monico Blitz, PA-C   1 mg at 03/28/16 0907  . estradiol (ESTRACE) tablet 0.5 mg  0.5 mg Oral Daily Nicole Pisciotta, PA-C   0.5 mg at 03/28/16 0905  . ferrous sulfate tablet 325 mg  325 mg Oral BID WC Nicole Pisciotta, PA-C   325 mg at 03/28/16 0907  . ibuprofen (ADVIL,MOTRIN) tablet 600 mg  600 mg Oral Q8H PRN Monico Blitz, PA-C      .  LORazepam (ATIVAN) tablet 1 mg  1 mg Oral Q8H PRN Nicole Pisciotta, PA-C      . lubiprostone (AMITIZA) capsule 24 mcg  24 mcg Oral Q breakfast Nicole Pisciotta, PA-C   24 mcg at 03/28/16 0906  . meclizine (ANTIVERT) tablet 50 mg  50 mg Oral TID PRN Monico Blitz, PA-C      . methocarbamol (ROBAXIN) tablet 750 mg  750 mg Oral BID Nicole Pisciotta, PA-C   750 mg at 03/28/16 0905  . metoCLOPramide (REGLAN) tablet 10 mg  10 mg Oral QID Nicole Pisciotta, PA-C   10 mg at 03/28/16 5102  . naltrexone (DEPADE) tablet 25 mg  25 mg Oral QHS Nicole Pisciotta, PA-C   25 mg at 03/27/16 2109  . nicotine  (NICODERM CQ - dosed in mg/24 hours) patch 21 mg  21 mg Transdermal Daily Nicole Pisciotta, PA-C   21 mg at 03/26/16 1721  . ondansetron (ZOFRAN) tablet 4 mg  4 mg Oral Q8H PRN Nicole Pisciotta, PA-C      . pantoprazole (PROTONIX) EC tablet 80 mg  80 mg Oral BID Nicole Pisciotta, PA-C   80 mg at 03/28/16 0907  . prazosin (MINIPRESS) capsule 5 mg  5 mg Oral QHS Corena Pilgrim, MD   5 mg at 03/27/16 2109  . promethazine (PHENERGAN) tablet 25 mg  25 mg Oral Q6H PRN Nicole Pisciotta, PA-C      . topiramate (TOPAMAX) tablet 100 mg  100 mg Oral QHS Shemicka Cohrs, MD   100 mg at 03/27/16 2109  . traZODone (DESYREL) tablet 50 mg  50 mg Oral QHS PRN Corena Pilgrim, MD      . venlafaxine XR (EFFEXOR-XR) 24 hr capsule 225 mg  225 mg Oral Daily Nicole Pisciotta, PA-C   225 mg at 03/28/16 5852   Current Outpatient Prescriptions  Medication Sig Dispense Refill  . baclofen (LIORESAL) 10 MG tablet Take 10-20 mg by mouth 2 (two) times daily. 10 mg in am and 20 mg at bed time    . buPROPion (WELLBUTRIN XL) 300 MG 24 hr tablet Take 300 mg by mouth daily.    . clonazePAM (KLONOPIN) 1 MG tablet Take 1 mg by mouth 3 (three) times daily.    Marland Kitchen estradiol (ESTRACE) 0.5 MG tablet Take 0.5 mg by mouth daily.    . ferrous sulfate 325 (65 FE) MG EC tablet Take 1 tablet (325 mg total) by mouth 2 (two) times daily with a meal.    . lubiprostone (AMITIZA) 24 MCG capsule Take 24 mcg by mouth daily with breakfast.    . meclizine (ANTIVERT) 50 MG tablet Take 1 tablet (50 mg total) by mouth 3 (three) times daily as needed. (Patient taking differently: Take 50 mg by mouth 3 (three) times daily as needed. Dizziness.) 30 tablet 0  . methocarbamol (ROBAXIN) 750 MG tablet Take 750 mg by mouth 2 (two) times daily.    . metoCLOPramide (REGLAN) 10 MG tablet Take 10 mg by mouth 4 (four) times daily.    . naltrexone (DEPADE) 50 MG tablet Take 25 mg by mouth at bedtime.    . pantoprazole (PROTONIX) 40 MG tablet Take 80 mg by mouth 2 (two)  times daily. Reported on 11/24/2015    . prazosin (MINIPRESS) 2 MG capsule Take 1 capsule (2 mg total) by mouth at bedtime. 30 capsule 0  . promethazine (PHENERGAN) 25 MG tablet Take 25 mg by mouth every 6 (six) hours as needed for nausea or vomiting.    Marland Kitchen  topiramate (TOPAMAX) 50 MG tablet Take 1 tablet (50 mg total) by mouth at bedtime. (Patient taking differently: Take 100 mg by mouth at bedtime. )    . Venlafaxine HCl (EFFEXOR XR PO) Take 225 mg by mouth daily.    . Na Sulfate-K Sulfate-Mg Sulf (SUPREP BOWEL PREP KIT) 17.5-3.13-1.6 GM/180ML SOLN Take 1 kit by mouth once. suprep as directed. No substitutions (Patient not taking: Reported on 03/26/2016) 354 mL 0    Musculoskeletal: Strength & Muscle Tone: within normal limits Gait & Station: normal Patient leans: N/A  Psychiatric Specialty Exam: Physical Exam  Constitutional: She is oriented to person, place, and time. She appears well-developed and well-nourished.  HENT:  Head: Normocephalic.  Neck: Normal range of motion.  Respiratory: Effort normal.  Musculoskeletal: Normal range of motion.  Neurological: She is alert and oriented to person, place, and time.  Skin: Skin is warm and dry.  Psychiatric: Her speech is normal and behavior is normal. Judgment and thought content normal. Cognition and memory are normal. She exhibits a depressed mood.    Review of Systems  Constitutional: Negative.   HENT: Negative.   Eyes: Negative.   Respiratory: Negative.   Cardiovascular: Negative.   Gastrointestinal: Negative.   Genitourinary: Negative.   Musculoskeletal: Negative.   Skin: Negative.   Neurological: Negative.   Endo/Heme/Allergies: Negative.   Psychiatric/Behavioral: Positive for depression.    Blood pressure 121/65, pulse 79, temperature 98.3 F (36.8 C), temperature source Oral, resp. rate 17, SpO2 100 %.There is no weight on file to calculate BMI.  General Appearance: Casual  Eye Contact:  Good  Speech:  Normal Rate   Volume:  Normal  Mood:  Depressed  Affect:  Congruent  Thought Process:  Coherent and Descriptions of Associations: Intact  Orientation:  Full (Time, Place, and Person)  Thought Content:  WDL  Suicidal Thoughts:  No  Homicidal Thoughts:  No  Memory:  Immediate;   Good Recent;   Good Remote;   Good  Judgement:  Good  Insight:  Good  Psychomotor Activity:  Normal  Concentration:  Concentration: Good and Attention Span: Good  Recall:  Good  Fund of Knowledge:  Good  Language:  Good  Akathisia:  No  Handed:  Right  AIMS (if indicated):     Assets:  Housing Leisure Time Physical Health Resilience Social Support  ADL's:  Intact  Cognition:  WNL  Sleep:        Treatment Plan Summary: Daily contact with patient to assess and evaluate symptoms and progress in treatment, Medication management and Plan major depressive disorder, recurrent, moderate without psychotic symptoms:  -Crisis stabilization -Medication management:  Continue her medical medications along with her Klonopin 1 mg TID for anxiety, Wellbutrin 300 mg daily for depression, Naltrexone 25 mg daily for suicidal ideations, Prazosin 5 mg at bedtime for nightmares, Trazodone 50 mg at bedtime PRN sleep, and Effexor 225 mg daily for depression -Individual counseling  Disposition: No evidence of imminent risk to self or others at present.    Waylan Boga, NP 03/28/2016 10:32 AM Patient seen face-to-face for psychiatric evaluation, chart reviewed and case discussed with the physician extender and developed treatment plan. Reviewed the information documented and agree with the treatment plan. Corena Pilgrim, MD

## 2016-03-28 NOTE — Discharge Instructions (Addendum)
For your ongoing behavioral health needs, you are advised to continue treatment at the [facility]:       [facility]      [address], [address]      [place], [place] [address]      [phone]  Follow up at your next scheduled appointment.  If you feel you need to be seen sooner, call the office so that they can squeeze you in.  Let them know that Dr. Mervyn Skeeters has approved this if needed.

## 2016-03-28 NOTE — ED Notes (Signed)
Patient discharged to home.  Denies any thoughts of harm to self or others.  All belongings returned and signed for.  Left the unit ambulatory and was escorted to the front lobby.  Her husband was waiting to pick her up.

## 2016-03-28 NOTE — [facility] Suicide Risk Assessment (Signed)
Suicide Risk Assessment  Discharge Assessment   [facility] Discharge Suicide Risk Assessment   Principal Problem: MDD (major depressive disorder), recurrent severe, without psychosis ([facility]) Discharge Diagnoses:  Patient Active Problem List   Diagnosis Date Noted  . MDD (major depressive disorder), recurrent severe, without psychosis ([facility]) [F33.2] 03/27/2016    Priority: High  . PTSD (post-traumatic stress disorder) [F43.10] 03/27/2016    Priority: High  . GAD (generalized anxiety disorder) [F41.1] 03/27/2016    Priority: High  . Suicidal ideation [R45.851]   . MDD (major depressive disorder), recurrent, severe, with psychosis ([facility]) [F33.3] 11/26/2015  . Anemia, iron deficiency [D50.9]   . Heme positive stool [R19.5]   . Depression [F32.9] 11/24/2015  . Multiple sclerosis ([facility]) [G35] 11/24/2015  . Anemia [D64.9] 11/24/2015  . Acute blood loss anemia [D62] 11/24/2015  . Symptomatic anemia [D64.9] 11/24/2015    Total Time spent with patient: 30 minutes  Musculoskeletal: Strength & Muscle Tone: within normal limits Gait & Station: normal Patient leans: N/A  Psychiatric Specialty Exam: Physical Exam  Constitutional: She is oriented to person, place, and time. She appears well-developed and well-nourished.  HENT:  Head: Normocephalic.  Neck: Normal range of motion.  Respiratory: Effort normal.  Musculoskeletal: Normal range of motion.  Neurological: She is alert and oriented to person, place, and time.  Skin: Skin is warm and dry.  Psychiatric: Her speech is normal and behavior is normal. Judgment and thought content normal. Cognition and memory are normal. She exhibits a depressed mood.    Review of Systems  Constitutional: Negative.   HENT: Negative.   Eyes: Negative.   Respiratory: Negative.   Cardiovascular: Negative.   Gastrointestinal: Negative.   Genitourinary: Negative.   Musculoskeletal: Negative.   Skin: Negative.   Neurological: Negative.   Endo/Heme/Allergies:  Negative.   Psychiatric/Behavioral: Positive for depression.    Blood pressure 121/65, pulse 79, temperature 98.3 F (36.8 C), temperature source Oral, resp. rate 17, SpO2 100 %.There is no weight on file to calculate BMI.  General Appearance: Casual  Eye Contact:  Good  Speech:  Normal Rate  Volume:  Normal  Mood:  Depressed  Affect:  Congruent  Thought Process:  Coherent and Descriptions of Associations: Intact  Orientation:  Full (Time, Place, and Person)  Thought Content:  WDL  Suicidal Thoughts:  No  Homicidal Thoughts:  No  Memory:  Immediate;   Good Recent;   Good Remote;   Good  Judgement:  Good  Insight:  Good  Psychomotor Activity:  Normal  Concentration:  Concentration: Good and Attention Span: Good  Recall:  Good  Fund of Knowledge:  Good  Language:  Good  Akathisia:  No  Handed:  Right  AIMS (if indicated):     Assets:  Housing Leisure Time Physical Health Resilience Social Support  ADL's:  Intact  Cognition:  WNL  Sleep:       Mental Status Per Nursing Assessment::   On Admission:   depression with suicidal ideations  Demographic Factors:  Caucasian and Living alone  Loss Factors: Decrease in vocational status  Historical Factors: NA  Risk Reduction Factors:   Sense of responsibility to family, Positive social support and Positive therapeutic relationship  Continued Clinical Symptoms:  Depression, moderate  Cognitive Features That Contribute To Risk:  None    Suicide Risk:  Minimal: No identifiable suicidal ideation.  Patients presenting with no risk factors but with morbid ruminations; may be classified as minimal risk based on the severity of the depressive symptoms  Plan Of Care/Follow-up recommendations:  Activity:  as tolerated  Diet:  heart healthy diet  LORD, JAMISON, NP 03/28/2016, 10:44 AM

## 2016-03-28 NOTE — BH Assessment (Signed)
[facility] Assessment Progress Note  Per Thedore Mins, MD, this pt does not require psychiatric hospitalization at this time.  Pt is to be discharged from [facility] with recommendation to continue treatment at the [facility], her current outpatient provider.  This has been included in pt's discharge instructions.  Pt's nurse, Dawnaly, has been notified.  Doylene Canning, MA Triage Specialist [phone]

## 2016-04-05 ENCOUNTER — Encounter: Payer: Self-pay | Admitting: Internal Medicine

## 2016-04-05 ENCOUNTER — Ambulatory Visit (AMBULATORY_SURGERY_CENTER): Payer: Medicaid Other | Admitting: Internal Medicine

## 2016-04-05 VITALS — BP 119/72 | HR 70 | Temp 97.8°F | Resp 15 | Ht 66.0 in | Wt 197.0 lb

## 2016-04-05 DIAGNOSIS — R195 Other fecal abnormalities: Secondary | ICD-10-CM | POA: Diagnosis not present

## 2016-04-05 DIAGNOSIS — D509 Iron deficiency anemia, unspecified: Secondary | ICD-10-CM

## 2016-04-05 MED ORDER — SODIUM CHLORIDE 0.9 % IV SOLN: 500.0000 mL | INTRAVENOUS | Status: DC

## 2016-04-05 NOTE — Patient Instructions (Signed)
YOU HAD AN ENDOSCOPIC PROCEDURE TODAY AT THE [facility]:   Refer to the procedure report that was given to you for any specific questions about what was found during the examination.  If the procedure report does not answer your questions, please call your gastroenterologist to clarify.  If you requested that your care partner not be given the details of your procedure findings, then the procedure report has been included in a sealed envelope for you to review at your convenience later.  YOU SHOULD EXPECT: Some feelings of bloating in the abdomen. Passage of more gas than usual.  Walking can help get rid of the air that was put into your GI tract during the procedure and reduce the bloating. If you had a lower endoscopy (such as a colonoscopy or flexible sigmoidoscopy) you may notice spotting of blood in your stool or on the toilet paper. If you underwent a bowel prep for your procedure, you may not have a normal bowel movement for a few days.  Please Note:  You might notice some irritation and congestion in your nose or some drainage.  This is from the oxygen used during your procedure.  There is no need for concern and it should clear up in a day or so.  SYMPTOMS TO REPORT IMMEDIATELY:   Following lower endoscopy (colonoscopy or flexible sigmoidoscopy):  Excessive amounts of blood in the stool  Significant tenderness or worsening of abdominal pains  Swelling of the abdomen that is new, acute  Fever of 100F or higher   For urgent or emergent issues, a gastroenterologist can be reached at any hour by calling [phone].   DIET: Your first meal following the procedure should be a small meal and then it is ok to progress to your normal diet. Heavy or fried foods are harder to digest and may make you feel nauseous or bloated.  Likewise, meals heavy in dairy and vegetables can increase bloating.  Drink plenty of fluids but you should avoid alcoholic beverages for 24  hours.  ACTIVITY:  You should plan to take it easy for the rest of today and you should NOT DRIVE or use heavy machinery until tomorrow (because of the sedation medicines used during the test).    FOLLOW UP: Our staff will call the number listed on your records the next business day following your procedure to check on you and address any questions or concerns that you may have regarding the information given to you following your procedure. If we do not reach you, we will leave a message.  However, if you are feeling well and you are not experiencing any problems, there is no need to return our call.  We will assume that you have returned to your regular daily activities without incident.  If any biopsies were taken you will be contacted by phone or by letter within the next 1-3 weeks.  Please call us at [phone] if you have not heard about the biopsies in 3 weeks.    SIGNATURES/CONFIDENTIALITY: You and/or your care partner have signed paperwork which will be entered into your electronic medical record.  These signatures attest to the fact that that the information above on your After Visit Summary has been reviewed and is understood.  Full responsibility of the confidentiality of this discharge information lies with you and/or your care-partner.  Capsule endoscopy to be scheduled by Dr. Lamar Sprinkles office.  Stop taking iron.  Repeat colonoscopy in 10 years for screening 2027.

## 2016-04-05 NOTE — Op Note (Signed)
[facility] Patient Name: Horace Wishon Procedure Date: 04/05/2016 10:49 AM MRN: [id number] Endoscopist: Wilhemina Bonito. Marina Goodell , MD Age: 50 Referring MD:  Date of Birth: [birth year] Gender: Female Account #: [id number] Procedure:                Colonoscopy Indications:              Heme positive stool, Iron deficiency anemia.                            Presented late February 2017. Normal upper                            endoscopy with normal duodenal biopsies 11/25/2015.                            Hemoglobin has normalized on iron therapy. Now for                            colonoscopy Medicines:                Monitored Anesthesia Care Procedure:                Pre-Anesthesia Assessment:                           - Prior to the procedure, a History and Physical                            was performed, and patient medications and                            allergies were reviewed. The patient's tolerance of                            previous anesthesia was also reviewed. The risks                            and benefits of the procedure and the sedation                            options and risks were discussed with the patient.                            All questions were answered, and informed consent                            was obtained. Prior Anticoagulants: The patient has                            taken no previous anticoagulant or antiplatelet                            agents. ASA Grade Assessment: II - A patient with  mild systemic disease. After reviewing the risks                            and benefits, the patient was deemed in                            satisfactory condition to undergo the procedure.                           After obtaining informed consent, the colonoscope                            was passed under direct vision. Throughout the                            procedure, the patient's blood pressure, pulse, and                     oxygen saturations were monitored continuously. The                            Model CF-HQ190L [id number]) scope was introduced                            through the anus and advanced to the the cecum,                            identified by appendiceal orifice and ileocecal                            valve. The ileocecal valve, appendiceal orifice,                            and rectum were photographed. The quality of the                            bowel preparation was good. The colonoscopy was                            performed without difficulty. The patient tolerated                            the procedure well. The bowel preparation used was                            SUPREP. Scope In: 11:03:23 AM Scope Out: 11:22:39 AM Scope Withdrawal Time: 0 hours 14 minutes 42 seconds  Total Procedure Duration: 0 hours 19 minutes 16 seconds  Findings:                 The terminal ileum appeared normal.                           The entire examined colon appeared normal on direct  and retroflexion views. Complications:            No immediate complications. Estimated blood loss:                            None. Estimated Blood Loss:     Estimated blood loss: none. Impression:               - The examined portion of the ileum was normal.                           - The entire examined colon is normal on direct and                            retroflexion views.                           - No specimens collected. Recommendation:           - Repeat colonoscopy in 10 years for screening                            purposes.                           - Stop iron                           - My office will arrange capsule endoscopy to                            evaluate further your previous iron deficiency                            anemia and Hemoccult-positive stool. Wilhemina Bonito. Marina Goodell, MD 04/05/2016 11:27:27 AM This report has been signed  electronically.

## 2016-04-05 NOTE — Progress Notes (Signed)
A and O x3. Report to RN. Tolerated MAC anesthesia well. 

## 2016-04-06 ENCOUNTER — Telehealth: Payer: Self-pay | Admitting: *Deleted

## 2016-04-06 NOTE — Telephone Encounter (Signed)
  Follow up Call-  Call back number 04/05/2016  Post procedure Call Back phone  # [phone]  Permission to leave phone message Yes     No answer, left message.

## 2016-04-12 ENCOUNTER — Other Ambulatory Visit: Payer: Self-pay

## 2016-04-12 DIAGNOSIS — R195 Other fecal abnormalities: Secondary | ICD-10-CM

## 2016-04-12 DIAGNOSIS — D509 Iron deficiency anemia, unspecified: Secondary | ICD-10-CM

## 2016-04-18 ENCOUNTER — Telehealth: Payer: Self-pay | Admitting: Internal Medicine

## 2016-04-18 NOTE — Telephone Encounter (Signed)
Returned call to pt and rescheduled capsule endo to 04/26/16.  PT has been instructed and will call with any questions or concerns.

## 2016-04-26 ENCOUNTER — Ambulatory Visit (INDEPENDENT_AMBULATORY_CARE_PROVIDER_SITE_OTHER): Payer: Medicaid Other | Admitting: Internal Medicine

## 2016-04-26 DIAGNOSIS — R195 Other fecal abnormalities: Secondary | ICD-10-CM

## 2016-04-26 DIAGNOSIS — D509 Iron deficiency anemia, unspecified: Secondary | ICD-10-CM

## 2016-04-26 NOTE — Progress Notes (Signed)
Patient here for capsule endoscopy. Tolerated procedure. Verbalizes understanding of written and verbal instructions. Pt to return equipment tomorrow. Monitor had to be borrowed from [facility] as video was being created on office monitor.   DDU#[id number] Exp:  06/17/17

## 2016-05-02 ENCOUNTER — Telehealth: Payer: Self-pay

## 2016-05-02 DIAGNOSIS — T184XXA Foreign body in colon, initial encounter: Secondary | ICD-10-CM

## 2016-05-02 NOTE — Telephone Encounter (Signed)
-----   Message from Hilarie Fredrickson, MD sent at 04/28/2016  7:17 PM EDT ----- Regarding: Capsule results Bonita Quin, Let patient know that capsule study was normal. Given her overall workup, she may have had intermittent minor blood loss from hiatal hernia related erosions, though no erosions seen at the time of her EGD (these can come and go and this is a very common benign cause for iron deficiency anemia). Her blood counts are now normal. Now that capsule completed, I'd recommend one daily iron tablet indefinitely and have periodic cbc with her PCP to assure no recurrent anemia. Absolutely no harm in a daily iron supplement (FeSO4 325 mg). Please covert this to phone note for the record and future reference.   Amy, as written above for your impression and plans on the final capsule report. Thanks  JP

## 2016-05-02 NOTE — Telephone Encounter (Signed)
Spoke with pt and she is aware. States she does not think she has passed the capsule. Pt to come for KUB this week. Order in epic.

## 2018-09-18 ENCOUNTER — Other Ambulatory Visit: Payer: Self-pay

## 2018-09-18 ENCOUNTER — Emergency Department (HOSPITAL_COMMUNITY)
Admission: EM | Admit: 2018-09-18 | Discharge: 2018-09-18 | Disposition: A | Discharge status: Home / Self Care | Payer: Self-pay | Attending: Emergency Medicine | Admitting: Emergency Medicine

## 2018-09-18 ENCOUNTER — Encounter (HOSPITAL_COMMUNITY): Payer: Self-pay | Admitting: *Deleted

## 2018-09-18 DIAGNOSIS — J449 Chronic obstructive pulmonary disease, unspecified: Secondary | ICD-10-CM | POA: Insufficient documentation

## 2018-09-18 DIAGNOSIS — N611 Abscess of the breast and nipple: Secondary | ICD-10-CM | POA: Insufficient documentation

## 2018-09-18 DIAGNOSIS — I10 Essential (primary) hypertension: Secondary | ICD-10-CM | POA: Insufficient documentation

## 2018-09-18 DIAGNOSIS — G35 Multiple sclerosis: Secondary | ICD-10-CM | POA: Insufficient documentation

## 2018-09-18 DIAGNOSIS — Z87891 Personal history of nicotine dependence: Secondary | ICD-10-CM | POA: Insufficient documentation

## 2018-09-18 DIAGNOSIS — Z79899 Other long term (current) drug therapy: Secondary | ICD-10-CM | POA: Insufficient documentation

## 2018-09-18 MED ORDER — DOXYCYCLINE HYCLATE 100 MG PO CAPS: 100.0000 mg | ORAL_CAPSULE | Freq: Two times a day (BID) | ORAL | 0 refills | Status: DC

## 2018-09-18 MED ORDER — LIDOCAINE-EPINEPHRINE-TETRACAINE (LET) SOLUTION
3.0000 mL | Freq: Once | NASAL | Status: AC
  Administered 2018-09-18: 3 mL via TOPICAL
  Filled 2018-09-18: qty 3

## 2018-09-18 MED ORDER — DOXYCYCLINE HYCLATE 100 MG PO CAPS: 100.0000 mg | ORAL_CAPSULE | Freq: Two times a day (BID) | ORAL | 0 refills | Status: AC

## 2018-09-18 MED ORDER — LIDOCAINE HCL (PF) 1 % IJ SOLN
5.0000 mL | Freq: Once | INTRAMUSCULAR | Status: AC
  Administered 2018-09-18: 5 mL via INTRADERMAL
  Filled 2018-09-18: qty 30

## 2018-09-18 NOTE — ED Triage Notes (Signed)
Pt states that her dog jumped on Friday night and since then an abscess has developed.  Pt has developed a quarter size abscess.  The area around the area is red and pt states she had a temp of 100.5 at home.  Pt reports some nausea.  Pt has been using warm compresses since Friday but the area has not improved.

## 2018-09-18 NOTE — ED Provider Notes (Signed)
[facility] Provider Note   CSN: [id number] Arrival date & time: 09/18/18  2125     History   Chief Complaint Chief Complaint  Patient presents with  . Abscess    Right breast    HPI Tracey Estrada is a 52 y.o. female.  The history is provided by the patient and medical records.  Abscess     52 y.o. F with hx of seasonal allergies, anemia, COPD, fibromyalgia, GERD, HTN, depression, MS, fatty liver disease, PTSD, presenting to the ED for abscess of right breast.  States she first noticed this about 4 days ago but seems to be getting better.  She reports she has been applying warm compresses but area seems to be getting bigger.  Today she started running a low grade fever.  Denies hx of MRSA or DM.    Past Medical History:  Diagnosis Date  . Allergy   . Anemia   . Blood transfusion without reported diagnosis   . COPD (chronic obstructive pulmonary disease) (HCC)   . DDD (degenerative disc disease), cervical   . Fibromyalgia   . GERD (gastroesophageal reflux disease)   . Hypertension   . Major depressive disorder   . MS (multiple sclerosis) (HCC)   . Nonalcoholic fatty liver disease   . Panic disorder   . PTSD (post-traumatic stress disorder)     Patient Active Problem List   Diagnosis Date Noted  . MDD (major depressive disorder), recurrent severe, without psychosis (HCC) 03/27/2016  . PTSD (post-traumatic stress disorder) 03/27/2016  . GAD (generalized anxiety disorder) 03/27/2016  . Suicidal ideation   . MDD (major depressive disorder), recurrent, severe, with psychosis (HCC) 11/26/2015  . Anemia, iron deficiency   . Heme positive stool   . Depression 11/24/2015  . Multiple sclerosis (HCC) 11/24/2015  . Anemia 11/24/2015  . Acute blood loss anemia 11/24/2015  . Symptomatic anemia 11/24/2015    Past Surgical History:  Procedure Laterality Date  . ABDOMINAL HYSTERECTOMY    . CESAREAN SECTION    . COLONOSCOPY     in  1990's   . ESOPHAGOGASTRODUODENOSCOPY N/A 11/25/2015   Procedure: ESOPHAGOGASTRODUODENOSCOPY (EGD);  Surgeon: Hilarie FredricksonJohn N Perry, MD;  Location: [facility];  Service: Endoscopy;  Laterality: N/A;  . HEMORRHOID SURGERY    . rectal fissure repair    . TONSILLECTOMY    . TUBAL LIGATION    . UPPER GASTROINTESTINAL ENDOSCOPY    . WRIST SURGERY       OB History   No obstetric history on file.      Home Medications    Prior to Admission medications   Medication Sig Start Date End Date Taking? Authorizing Provider  buPROPion (WELLBUTRIN XL) 300 MG 24 hr tablet Take 300 mg by mouth daily.    [provider]  clonazePAM (KLONOPIN) 1 MG tablet Take 1 mg by mouth 3 (three) times daily.    [provider]  estradiol (ESTRACE) 0.5 MG tablet Take 0.5 mg by mouth daily.    [provider]  ferrous sulfate 325 (65 FE) MG EC tablet Take 1 tablet (325 mg total) by mouth 2 (two) times daily with a meal. 11/26/15   Standley BrookingGoodrich, Daniel P, MD  lubiprostone (AMITIZA) 24 MCG capsule Take 24 mcg by mouth daily with breakfast.    [provider]  meclizine (ANTIVERT) 50 MG tablet Take 1 tablet (50 mg total) by mouth 3 (three) times daily as needed. Patient taking differently: Take 50 mg by  mouth 3 (three) times daily as needed. Dizziness. 09/03/12   Mabe, Latanya Maudlin, MD  methocarbamol (ROBAXIN) 750 MG tablet Take 750 mg by mouth 2 (two) times daily.    [provider]  metoCLOPramide (REGLAN) 10 MG tablet Take 10 mg by mouth 4 (four) times daily.    [provider]  naltrexone (DEPADE) 50 MG tablet Take 25 mg by mouth at bedtime.    [provider]  pantoprazole (PROTONIX) 40 MG tablet Take 80 mg by mouth 2 (two) times daily. Reported on 11/24/2015    [provider]  prazosin (MINIPRESS) 2 MG capsule Take 1 capsule (2 mg total) by mouth at bedtime. 11/26/15   Standley Brooking, MD  promethazine (PHENERGAN) 25 MG tablet Take 25 mg by mouth every 6 (six)  hours as needed for nausea or vomiting.    [provider]  topiramate (TOPAMAX) 50 MG tablet Take 1 tablet (50 mg total) by mouth at bedtime. Patient taking differently: Take 100 mg by mouth at bedtime.  11/26/15   Standley Brooking, MD  Venlafaxine HCl (EFFEXOR XR PO) Take 225 mg by mouth daily.    [provider]    Family History Family History  Problem Relation Age of Onset  . Hypertension Mother   . Heart failure Mother   . Hypertension Sister   . Stroke Sister   . Colon cancer Sister        started in lungs- mets to brain and colon   . Lung cancer Sister   . Colon cancer Paternal Grandfather   . Colon cancer Other        pat.great aunt   . Colon polyps Neg Hx   . Esophageal cancer Neg Hx   . Rectal cancer Neg Hx   . Stomach cancer Neg Hx     Social History Social History   Tobacco Use  . Smoking status: Former Smoker    Last attempt to quit: 11/24/2012    Years since quitting: 5.8  . Smokeless tobacco: Never Used  Substance Use Topics  . Alcohol use: No    Alcohol/week: 0.0 standard drinks  . Drug use: No     Allergies   Other   Review of Systems Review of Systems  Skin:       abscess  All other systems reviewed and are negative.    Physical Exam Updated Vital Signs BP (!) 155/80 (BP Location: Right Arm)   Pulse 81   Temp 99 F (37.2 C) (Oral)   Resp 15   Ht 5\' 7"  (1.702 m)   Wt 89.8 kg   SpO2 99%   BMI 31.01 kg/m   Physical Exam Vitals signs and nursing note reviewed.  Constitutional:      Appearance: She is well-developed.     Comments: Non-toxic in appearance  HENT:     Head: Normocephalic and atraumatic.  Eyes:     Conjunctiva/sclera: Conjunctivae normal.     Pupils: Pupils are equal, round, and reactive to light.  Neck:     Musculoskeletal: Normal range of motion.  Cardiovascular:     Rate and Rhythm: Normal rate and regular rhythm.     Heart sounds: Normal heart sounds.  Pulmonary:     Effort: Pulmonary  effort is normal.     Breath sounds: Normal breath sounds.  Chest:    Abdominal:     General: Bowel sounds are normal.     Palpations: Abdomen is soft.  Musculoskeletal: Normal  range of motion.  Skin:    General: Skin is warm and dry.  Neurological:     Mental Status: She is alert and oriented to person, place, and time.      ED Treatments / Results  Labs (all labs ordered are listed, but only abnormal results are displayed) Labs Reviewed - No data to display  EKG None  Radiology No results found.  Procedures Procedures (including critical care time)  INCISION AND DRAINAGE Performed by: Garlon Hatchet Consent: Verbal consent obtained. Risks and benefits: risks, benefits and alternatives were discussed Type: abscess  Body area: right breast  Anesthesia: local infiltration  Incision was made with a scalpel.  Local anesthetic: LET followed by lidocaine 1% without epinephrine  Anesthetic total: 4 ml  Complexity: complex Blunt dissection to break up loculations  Drainage: purulent  Drainage amount: large  Packing material: none  Patient tolerance: Patient tolerated the procedure well with no immediate complications.     Medications Ordered in ED Medications  lidocaine-EPINEPHrine-tetracaine (LET) solution (3 mLs Topical Given 09/18/18 2219)  lidocaine (PF) (XYLOCAINE) 1 % injection 5 mL (5 mLs Intradermal Given 09/18/18 2219)     Initial Impression / Assessment and Plan / ED Course  I have reviewed the triage vital signs and the nursing notes.  Pertinent labs & imaging results that were available during my care of the patient were reviewed by me and considered in my medical decision making (see chart for details).  52 year old female here with abscess of right breast.  First noticed this 4 days ago but has been worsening.  She is afebrile and nontoxic.  On exam she has a 2 cm fluctuant abscess of right breast beneath the right nipple.  There is no  active drainage or bleeding.  She does have some surrounding erythema.  I&D performed as above, patient tolerated well.  Large amount of purulent drainage.  Wound cleansed and dressed.  We will plan to discharge home with antibiotics, encouraged home wound care and warm compresses.  She will follow-up closely with her primary care doctor later in the week for recheck.  She will return here for any new or worsening symptoms.  Final Clinical Impressions(s) / ED Diagnoses   Final diagnoses:  Breast abscess    ED Discharge Orders         Ordered     09/18/18 2245    doxycycline (VIBRAMYCIN) 100 MG capsule  2 times daily     09/18/18 2246           Garlon Hatchet, PA-C 09/18/18 2249    Sabas Sous, MD 09/18/18 2318

## 2018-09-18 NOTE — Discharge Instructions (Signed)
Take the prescribed medication as directed.  Can continue warm compresses. Follow-up with your primary care doctor for re-check later in the week. Return to the ED for new or worsening symptoms.
